# Patient Record
Sex: Female | Born: 1990 | Race: Black or African American | Hispanic: No | Marital: Single | Smoking: Heavy tobacco smoker
Health system: Southern US, Community
[De-identification: ages and names within clinical notes are randomized; demographics above are authoritative.]

## PROBLEM LIST (undated history)

## (undated) ENCOUNTER — Inpatient Hospital Stay (HOSPITAL_COMMUNITY): Payer: Self-pay

## (undated) DIAGNOSIS — Z789 Other specified health status: Secondary | ICD-10-CM

## (undated) HISTORY — PX: DILATION AND CURETTAGE OF UTERUS: SHX78

---
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
## 2012-11-03 ENCOUNTER — Other Ambulatory Visit: Payer: Self-pay | Admitting: Nurse Practitioner

## 2012-11-03 ENCOUNTER — Other Ambulatory Visit (HOSPITAL_COMMUNITY)
Admission: RE | Admit: 2012-11-03 | Discharge: 2012-11-03 | Disposition: A | Discharge status: Home / Self Care | Payer: Medicaid Other | Source: Ambulatory Visit | Attending: Obstetrics and Gynecology | Admitting: Obstetrics and Gynecology

## 2012-11-03 DIAGNOSIS — Z01419 Encounter for gynecological examination (general) (routine) without abnormal findings: Secondary | ICD-10-CM | POA: Insufficient documentation

## 2013-05-11 ENCOUNTER — Emergency Department (HOSPITAL_COMMUNITY)
Admission: EM | Admit: 2013-05-11 | Discharge: 2013-05-12 | Disposition: A | Discharge status: Home / Self Care | Payer: No Typology Code available for payment source | Attending: Emergency Medicine | Admitting: Emergency Medicine

## 2013-05-11 DIAGNOSIS — S6990XA Unspecified injury of unspecified wrist, hand and finger(s), initial encounter: Secondary | ICD-10-CM | POA: Insufficient documentation

## 2013-05-11 DIAGNOSIS — S4980XA Other specified injuries of shoulder and upper arm, unspecified arm, initial encounter: Secondary | ICD-10-CM | POA: Insufficient documentation

## 2013-05-11 DIAGNOSIS — Y9389 Activity, other specified: Secondary | ICD-10-CM | POA: Insufficient documentation

## 2013-05-11 DIAGNOSIS — IMO0002 Reserved for concepts with insufficient information to code with codable children: Secondary | ICD-10-CM | POA: Insufficient documentation

## 2013-05-11 DIAGNOSIS — S59909A Unspecified injury of unspecified elbow, initial encounter: Secondary | ICD-10-CM | POA: Insufficient documentation

## 2013-05-11 DIAGNOSIS — S0993XA Unspecified injury of face, initial encounter: Secondary | ICD-10-CM | POA: Insufficient documentation

## 2013-05-11 DIAGNOSIS — Y92410 Unspecified street and highway as the place of occurrence of the external cause: Secondary | ICD-10-CM | POA: Insufficient documentation

## 2013-05-11 DIAGNOSIS — S46909A Unspecified injury of unspecified muscle, fascia and tendon at shoulder and upper arm level, unspecified arm, initial encounter: Secondary | ICD-10-CM | POA: Insufficient documentation

## 2013-05-11 MED ORDER — IBUPROFEN 200 MG PO TABS
600.0000 mg | ORAL_TABLET | Freq: Once | ORAL | Status: AC
  Administered 2013-05-11: 600 mg via ORAL
  Filled 2013-05-11: qty 3

## 2013-05-11 MED ORDER — HYDROCODONE-ACETAMINOPHEN 5-325 MG PO TABS
1.0000 | ORAL_TABLET | Freq: Once | ORAL | Status: AC
  Administered 2013-05-11: 1 via ORAL
  Filled 2013-05-11: qty 1
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
## 2013-05-12 ENCOUNTER — Emergency Department (HOSPITAL_COMMUNITY): Payer: No Typology Code available for payment source

## 2013-05-12 MED ORDER — IBUPROFEN 800 MG PO TABS: 800.0000 mg | ORAL_TABLET | Freq: Three times a day (TID) | ORAL | Status: DC

## 2013-05-12 MED ORDER — CYCLOBENZAPRINE HCL 10 MG PO TABS: 10.0000 mg | ORAL_TABLET | Freq: Three times a day (TID) | ORAL | Status: DC | PRN

## 2013-05-12 MED ORDER — HYDROCODONE-ACETAMINOPHEN 5-325 MG PO TABS: 1.0000 | ORAL_TABLET | Freq: Four times a day (QID) | ORAL | Status: DC | PRN

## 2013-05-12 NOTE — ED Provider Notes (Signed)
Medical screening examination/treatment/procedure(s) were performed by non-physician practitioner and as supervising physician I was immediately available for consultation/collaboration.   Junius Argyle, MD 05/12/13 [phone]

## 2013-09-06 NOTE — L&D Delivery Note (Signed)
Delivery Note At 7:00 PM a viable female was delivered via Vaginal, Spontaneous Delivery (Presentation: occiput anterior ;  ).  APGAR:8 , 9; weight 4 lb 5.1 oz (1960 g).   Placenta status: Intact, Spontaneous.  Cord: 3 vessels with the following complications: None.  Cord pH: pending   Anesthesia: Epidural  Episiotomy: None Lacerations: 1st degree left periurethral  Suture Repair: 3.0 vicryl Est. Blood Loss (mL): 250  Mom to postpartum.  Baby to NICU.  Shawntell Dixson J. 02/23/2014, 7:20 PM

## 2013-09-13 LAB — OB RESULTS CONSOLE ABO/RH: RH TYPE: POSITIVE

## 2013-09-13 LAB — OB RESULTS CONSOLE RPR: RPR: NONREACTIVE

## 2013-09-13 LAB — OB RESULTS CONSOLE HIV ANTIBODY (ROUTINE TESTING): HIV: NONREACTIVE

## 2013-09-13 LAB — OB RESULTS CONSOLE RUBELLA ANTIBODY, IGM: RUBELLA: IMMUNE

## 2013-09-13 LAB — OB RESULTS CONSOLE HEPATITIS B SURFACE ANTIGEN: Hepatitis B Surface Ag: NEGATIVE

## 2013-09-13 LAB — OB RESULTS CONSOLE ANTIBODY SCREEN: ANTIBODY SCREEN: NEGATIVE

## 2013-10-04 ENCOUNTER — Inpatient Hospital Stay (HOSPITAL_COMMUNITY)
Admission: AD | Admit: 2013-10-04 | Discharge: 2013-10-04 | Disposition: A | Discharge status: Home / Self Care | Payer: Medicaid Other | Source: Ambulatory Visit | Attending: Obstetrics & Gynecology | Admitting: Obstetrics & Gynecology

## 2013-10-04 ENCOUNTER — Encounter (HOSPITAL_COMMUNITY): Payer: Self-pay

## 2013-10-04 ENCOUNTER — Inpatient Hospital Stay (HOSPITAL_COMMUNITY): Payer: Self-pay

## 2013-10-04 DIAGNOSIS — O468X2 Other antepartum hemorrhage, second trimester: Secondary | ICD-10-CM

## 2013-10-04 DIAGNOSIS — O418X20 Other specified disorders of amniotic fluid and membranes, second trimester, not applicable or unspecified: Secondary | ICD-10-CM

## 2013-10-04 DIAGNOSIS — R109 Unspecified abdominal pain: Secondary | ICD-10-CM | POA: Insufficient documentation

## 2013-10-04 DIAGNOSIS — O4590 Premature separation of placenta, unspecified, unspecified trimester: Secondary | ICD-10-CM

## 2013-10-04 DIAGNOSIS — O209 Hemorrhage in early pregnancy, unspecified: Secondary | ICD-10-CM | POA: Insufficient documentation

## 2013-10-04 HISTORY — DX: Other specified health status: Z78.9

## 2013-10-04 LAB — CBC
HCT: 36.7 % (ref 36.0–46.0)
HEMOGLOBIN: 12.9 g/dL (ref 12.0–15.0)
MCH: 29.3 pg (ref 26.0–34.0)
MCHC: 35.1 g/dL (ref 30.0–36.0)
MCV: 83.4 fL (ref 78.0–100.0)
PLATELETS: 219 10*3/uL (ref 150–400)
RBC: 4.4 MIL/uL (ref 3.87–5.11)
RDW: 13.4 % (ref 11.5–15.5)
WBC: 6.2 10*3/uL (ref 4.0–10.5)
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
## 2013-10-04 NOTE — MAU Provider Note (Signed)
History     CSN: [id number]  Arrival date and time: 10/04/13 [id number]   First Provider Initiated Contact with Patient 10/04/13 0400      Chief Complaint  Patient presents with  . Vaginal Bleeding   HPI [name] is a 23 y.o. [id number] at 4919w5d who presents to MAU today with new onset bright red bleeding since 0330 today. She also had right sided suprapubic pain at that time rated at 8/10 at the worst. She rates her pain now at 3/10. She denies bleeding earlier in the pregnancy aside from occasional spotting. She states that she woke up and the blood had soaked her clothes. She hasn't noted significant bleeding since then. She endorses nausea and occasional scant discharge. She denies vomiting, diarrhea, constipation, weakness, dizziness or fatigue today. She has a history of PTD at 26 weeks with previous pregnancy.   OB History   Grav Para Term Preterm Abortions TAB SAB Ect Mult Living   3 1 0 1 1 1 0 0 0 1       Past Medical History  Diagnosis Date  . No pertinent past medical history   . Medical history non-contributory     Past Surgical History  Procedure Laterality Date  . No past surgeries      History reviewed. No pertinent family history.  History  Substance Use Topics  . Smoking status: Never Smoker   . Smokeless tobacco: Never Used  . Alcohol Use: No    Allergies: No Known Allergies  Prescriptions prior to admission  Medication Sig Dispense Refill  . cyclobenzaprine (FLEXERIL) 10 MG tablet Take 1 tablet (10 mg total) by mouth 3 (three) times daily as needed for muscle spasms.  15 tablet  0  . HYDROcodone-acetaminophen (NORCO) 5-325 MG per tablet Take 1 tablet by mouth every 6 (six) hours as needed for pain.  20 tablet  0  . ibuprofen (ADVIL,MOTRIN) 800 MG tablet Take 1 tablet (800 mg total) by mouth 3 (three) times daily.  21 tablet  0    Review of Systems  Constitutional: Negative for fever and malaise/fatigue.  Gastrointestinal: Positive for nausea and  abdominal pain. Negative for vomiting, diarrhea and constipation.  Genitourinary: Negative for dysuria, urgency and frequency.       + vaginal bleeding, discharge  Neurological: Negative for dizziness and weakness.   Physical Exam   Blood pressure 119/65, pulse 85, temperature 98.1 F (36.7 C), temperature source Oral, resp. rate 20, height 5\' 3"  (1.6 m), weight 145 lb (65.772 kg), last menstrual period 05/10/2013.  Physical Exam  Constitutional: She is oriented to person, place, and time. She appears well-developed and well-nourished. No distress.  HENT:  Head: Normocephalic and atraumatic.  Cardiovascular: Normal rate, regular rhythm and normal heart sounds.   Respiratory: Effort normal and breath sounds normal. No respiratory distress.  GI: Soft. Bowel sounds are normal. She exhibits no distension and no mass. Tenderness: mild tenderness to palpation of the suprapubic region at midline. There is no rebound and no guarding.  Genitourinary: Uterus is enlarged (appropriate for GA) and tender (mild). Cervix exhibits no motion tenderness, no discharge and no friability. There is bleeding (small amount of blood in the vagina) around the vagina. No vaginal discharge found.  Neurological: She is alert and oriented to person, place, and time.  Skin: Skin is warm and dry. No erythema.  Psychiatric: She has a normal mood and affect.   Results for orders placed during the hospital encounter of 10/04/13 (from the  past 24 hour(s))  CBC     Status: None   Collection Time    10/04/13  5:05 AM      Result Value Range   WBC 6.2  4.0 - 10.5 K/uL   RBC 4.40  3.87 - 5.11 MIL/uL   Hemoglobin 12.9  12.0 - 15.0 g/dL   HCT 16.1  09.6 - 04.5 %   MCV 83.4  78.0 - 100.0 fL   MCH 29.3  26.0 - 34.0 pg   MCHC 35.1  30.0 - 36.0 g/dL   RDW 40.9  81.1 - 91.4 %   Platelets 219  150 - 400 K/uL   [place] - small subchorionic hemorrhage FHR - 153 bpm MAU Course  Procedures None  MDM FHR - 155 bpm with  doppler Discussed with Dr. Charlotta Newton. Order CBC and [place] today Discussed [place] and lab results with Dr. Charlotta Newton. Ok for discharge with precautions and pelvic rest Assessment and Plan  A: Subchorionic hemorrhage  P: Discharge home Pelvic rest advised Bleeding precautions discussed Patient advised to follow-up in the office in 1 week Patient may return to MAU as needed or if her condition were to change or worsen  Freddi Starr, PA-C  10/04/2013, 6:03 AM

## 2013-10-04 NOTE — Discharge Instructions (Signed)
Pelvic Rest °Pelvic rest is sometimes recommended for women when:  °· The placenta is partially or completely covering the opening of the cervix (placenta previa). °· There is bleeding between the uterine wall and the amniotic sac in the first trimester (subchorionic hemorrhage). °· The cervix begins to open without labor starting (incompetent cervix, cervical insufficiency). °· The labor is too early (preterm labor). °HOME CARE INSTRUCTIONS °· Do not have sexual intercourse, stimulation, or an orgasm. °· Do not use tampons, douche, or put anything in the vagina. °· Do not lift anything over 10 pounds (4.5 kg). °· Avoid strenuous activity or straining your pelvic muscles. °SEEK MEDICAL CARE IF:  °· You have any vaginal bleeding during pregnancy. Treat this as a potential emergency. °· You have cramping pain felt low in the stomach (stronger than menstrual cramps). °· You notice vaginal discharge (watery, mucus, or bloody). °· You have a low, dull backache. °· There are regular contractions or uterine tightening. °SEEK IMMEDIATE MEDICAL CARE IF: °You have vaginal bleeding and have placenta previa.  °Document Released: 12/18/2010 Document Revised: 11/15/2011 Document Reviewed: 12/18/2010 °ExitCare® Patient Information ©2014 [employer]. ° °Vaginal Bleeding During Pregnancy, Second Trimester °A small amount of bleeding (spotting) from the vagina is relatively common in pregnancy. It usually stops on its own. Various things can cause bleeding or spotting in pregnancy. Some bleeding may be related to the pregnancy, and some may not. Sometimes the bleeding is normal and is not a problem. However, bleeding can also be a sign of something serious. Be sure to tell your health care provider about any vaginal bleeding right away. °Some possible causes of vaginal bleeding during the second trimester include: °· Infection, inflammation, or growths on the cervix.   °· The placenta may be partially or completely covering the  opening of the cervix inside the uterus (placenta previa). °· The placenta may have separated from the uterus (abruption of the placenta).   °· You may be having early (preterm) labor.   °· The cervix may not be strong enough to keep a baby inside the uterus (cervical insufficiency).   °· Tiny cysts may have developed in the uterus instead of pregnancy tissue (molar pregnancy).  °HOME CARE INSTRUCTIONS  °Watch your condition for any changes. The following actions may help to lessen any discomfort you are feeling: °· Follow your health care provider's instructions for limiting your activity. If your health care provider orders bed rest, you may need to stay in bed and only get up to use the bathroom. However, your health care provider may allow you to continue light activity. °· If needed, make plans for someone to help with your regular activities and responsibilities while you are on bed rest. °· Keep track of the number of pads you use each day, how often you change pads, and how soaked (saturated) they are. Write this down. °· Do not use tampons. Do not douche. °· Do not have sexual intercourse or orgasms until approved by your health care provider. °· If you pass any tissue from your vagina, save the tissue so you can show it to your health care provider. °· Only take over-the-counter or prescription medicines as directed by your health care provider. °· Do not take aspirin because it can make you bleed. °· Do not exercise or perform any strenuous activities or heavy lifting without your health care provider's permission. °· Keep all follow-up appointments as directed by your health care provider. °SEEK MEDICAL CARE IF: °· You have any vaginal bleeding during any part   of your pregnancy. °· You have cramps or labor pains. °SEEK IMMEDIATE MEDICAL CARE IF:  °· You have severe cramps in your back or belly (abdomen). °· You have contractions. °· You have a fever, not controlled by medicine. °· You have chills. °· You  pass large clots or tissue from your vagina. °· Your bleeding increases. °· You feel lightheaded or weak, or you have fainting episodes. °· You are leaking fluid or have a gush of fluid from your vagina. °MAKE SURE YOU: °· Understand these instructions. °· Will watch your condition. °· Will get help right away if you are not doing well or get worse. °Document Released: 06/02/2005 Document Revised: 06/13/2013 Document Reviewed: 04/30/2013 °ExitCare® Patient Information ©2014 [employer]. ° °

## 2014-02-23 ENCOUNTER — Inpatient Hospital Stay (HOSPITAL_COMMUNITY)
Admission: AD | Admit: 2014-02-23 | Discharge: 2014-02-25 | DRG: 775 | Disposition: A | Discharge status: Home / Self Care | Payer: Medicaid Other | Source: Ambulatory Visit | Attending: Obstetrics and Gynecology | Admitting: Obstetrics and Gynecology

## 2014-02-23 ENCOUNTER — Encounter (HOSPITAL_COMMUNITY): Payer: Medicaid Other | Admitting: Anesthesiology

## 2014-02-23 ENCOUNTER — Inpatient Hospital Stay (HOSPITAL_COMMUNITY): Payer: Medicaid Other | Admitting: Anesthesiology

## 2014-02-23 ENCOUNTER — Encounter (HOSPITAL_COMMUNITY): Payer: Self-pay | Admitting: *Deleted

## 2014-02-23 DIAGNOSIS — O0930 Supervision of pregnancy with insufficient antenatal care, unspecified trimester: Secondary | ICD-10-CM

## 2014-02-23 DIAGNOSIS — Z22330 Carrier of Group B streptococcus: Secondary | ICD-10-CM | POA: Diagnosis not present

## 2014-02-23 DIAGNOSIS — O99892 Other specified diseases and conditions complicating childbirth: Secondary | ICD-10-CM | POA: Diagnosis present

## 2014-02-23 DIAGNOSIS — O4700 False labor before 37 completed weeks of gestation, unspecified trimester: Secondary | ICD-10-CM | POA: Diagnosis present

## 2014-02-23 DIAGNOSIS — O09219 Supervision of pregnancy with history of pre-term labor, unspecified trimester: Secondary | ICD-10-CM | POA: Diagnosis present

## 2014-02-23 DIAGNOSIS — IMO0001 Reserved for inherently not codable concepts without codable children: Secondary | ICD-10-CM

## 2014-02-23 DIAGNOSIS — O9989 Other specified diseases and conditions complicating pregnancy, childbirth and the puerperium: Secondary | ICD-10-CM

## 2014-02-23 LAB — URINALYSIS, ROUTINE W REFLEX MICROSCOPIC
Bilirubin Urine: NEGATIVE
GLUCOSE, UA: NEGATIVE mg/dL
Hgb urine dipstick: NEGATIVE
Ketones, ur: NEGATIVE mg/dL
LEUKOCYTES UA: NEGATIVE
NITRITE: NEGATIVE
PH: 7 (ref 5.0–8.0)
Protein, ur: NEGATIVE mg/dL
SPECIFIC GRAVITY, URINE: 1.015 (ref 1.005–1.030)
Urobilinogen, UA: 0.2 mg/dL (ref 0.0–1.0)

## 2014-02-23 LAB — CBC
HCT: 35.9 % — ABNORMAL LOW (ref 36.0–46.0)
HEMOGLOBIN: 12.4 g/dL (ref 12.0–15.0)
MCH: 30.4 pg (ref 26.0–34.0)
MCHC: 34.5 g/dL (ref 30.0–36.0)
MCV: 88 fL (ref 78.0–100.0)
PLATELETS: 194 10*3/uL (ref 150–400)
RBC: 4.08 MIL/uL (ref 3.87–5.11)
RDW: 13.8 % (ref 11.5–15.5)
WBC: 12 10*3/uL — AB (ref 4.0–10.5)

## 2014-02-23 LAB — RAPID URINE DRUG SCREEN, HOSP PERFORMED
Amphetamines: NOT DETECTED
BARBITURATES: NOT DETECTED
BENZODIAZEPINES: NOT DETECTED
Cocaine: NOT DETECTED
Opiates: NOT DETECTED
TETRAHYDROCANNABINOL: NOT DETECTED

## 2014-02-23 LAB — OB RESULTS CONSOLE GBS: GBS: POSITIVE

## 2014-02-23 MED ORDER — LORATADINE 10 MG PO TABS
10.0000 mg | ORAL_TABLET | Freq: Every day | ORAL | Status: DC
Start: 2014-02-23 — End: 2014-02-25
  Administered 2014-02-23 – 2014-02-25 (×3): 10 mg via ORAL
  Filled 2014-02-23 (×3): qty 1

## 2014-02-23 MED ORDER — LACTATED RINGERS IV SOLN: INTRAVENOUS | Status: DC

## 2014-02-23 MED ORDER — METHYLERGONOVINE MALEATE 0.2 MG/ML IJ SOLN: 0.2000 mg | INTRAMUSCULAR | Status: DC | PRN

## 2014-02-23 MED ORDER — OXYTOCIN 40 UNITS IN LACTATED RINGERS INFUSION - SIMPLE MED
62.5000 mL/h | INTRAVENOUS | Status: DC
  Administered 2014-02-23: 62.5 mL/h via INTRAVENOUS
  Filled 2014-02-23: qty 1000

## 2014-02-23 MED ORDER — OXYCODONE-ACETAMINOPHEN 5-325 MG PO TABS: 1.0000 | ORAL_TABLET | ORAL | Status: DC | PRN

## 2014-02-23 MED ORDER — TETANUS-DIPHTH-ACELL PERTUSSIS 5-2.5-18.5 LF-MCG/0.5 IM SUSP
0.5000 mL | Freq: Once | INTRAMUSCULAR | Status: AC
  Administered 2014-02-25: 0.5 mL via INTRAMUSCULAR
  Filled 2014-02-23: qty 0.5

## 2014-02-23 MED ORDER — EPHEDRINE 5 MG/ML INJ
10.0000 mg | INTRAVENOUS | Status: DC | PRN
Start: 2014-02-23 — End: 2014-02-23
  Filled 2014-02-23: qty 2

## 2014-02-23 MED ORDER — LACTATED RINGERS IV SOLN: 500.0000 mL | Freq: Once | INTRAVENOUS | Status: DC

## 2014-02-23 MED ORDER — ZOLPIDEM TARTRATE 5 MG PO TABS: 5.0000 mg | ORAL_TABLET | Freq: Every evening | ORAL | Status: DC | PRN

## 2014-02-23 MED ORDER — EPHEDRINE 5 MG/ML INJ
10.0000 mg | INTRAVENOUS | Status: DC | PRN
  Filled 2014-02-23: qty 2

## 2014-02-23 MED ORDER — CITRIC ACID-SODIUM CITRATE 334-500 MG/5ML PO SOLN: 30.0000 mL | ORAL | Status: DC | PRN

## 2014-02-23 MED ORDER — IBUPROFEN 600 MG PO TABS
600.0000 mg | ORAL_TABLET | Freq: Four times a day (QID) | ORAL | Status: DC
  Administered 2014-02-23 – 2014-02-25 (×5): 600 mg via ORAL
  Filled 2014-02-23 (×5): qty 1

## 2014-02-23 MED ORDER — PHENYLEPHRINE 40 MCG/ML (10ML) SYRINGE FOR IV PUSH (FOR BLOOD PRESSURE SUPPORT)
PREFILLED_SYRINGE | INTRAVENOUS | Status: AC
  Filled 2014-02-23: qty 10

## 2014-02-23 MED ORDER — DIPHENHYDRAMINE HCL 25 MG PO CAPS
25.0000 mg | ORAL_CAPSULE | Freq: Four times a day (QID) | ORAL | Status: DC | PRN
Start: 2014-02-23 — End: 2014-02-25

## 2014-02-23 MED ORDER — OXYTOCIN BOLUS FROM INFUSION: 500.0000 mL | INTRAVENOUS | Status: DC

## 2014-02-23 MED ORDER — PHENYLEPHRINE 40 MCG/ML (10ML) SYRINGE FOR IV PUSH (FOR BLOOD PRESSURE SUPPORT)
80.0000 ug | PREFILLED_SYRINGE | INTRAVENOUS | Status: DC | PRN
  Filled 2014-02-23: qty 2

## 2014-02-23 MED ORDER — SENNOSIDES-DOCUSATE SODIUM 8.6-50 MG PO TABS
2.0000 | ORAL_TABLET | ORAL | Status: DC
  Administered 2014-02-25: 2 via ORAL
  Filled 2014-02-23 (×2): qty 2

## 2014-02-23 MED ORDER — METHYLERGONOVINE MALEATE 0.2 MG PO TABS: 0.2000 mg | ORAL_TABLET | ORAL | Status: DC | PRN

## 2014-02-23 MED ORDER — LIDOCAINE HCL (PF) 1 % IJ SOLN
30.0000 mL | INTRAMUSCULAR | Status: DC | PRN
  Filled 2014-02-23: qty 30

## 2014-02-23 MED ORDER — ONDANSETRON HCL 4 MG/2ML IJ SOLN: 4.0000 mg | INTRAMUSCULAR | Status: DC | PRN

## 2014-02-23 MED ORDER — FERROUS SULFATE 325 (65 FE) MG PO TABS
325.0000 mg | ORAL_TABLET | Freq: Two times a day (BID) | ORAL | Status: DC
  Administered 2014-02-24 – 2014-02-25 (×2): 325 mg via ORAL
  Filled 2014-02-23 (×2): qty 1

## 2014-02-23 MED ORDER — ACETAMINOPHEN 325 MG PO TABS: 650.0000 mg | ORAL_TABLET | ORAL | Status: DC | PRN

## 2014-02-23 MED ORDER — WITCH HAZEL-GLYCERIN EX PADS: 1.0000 "application " | MEDICATED_PAD | CUTANEOUS | Status: DC | PRN

## 2014-02-23 MED ORDER — PHENYLEPHRINE 40 MCG/ML (10ML) SYRINGE FOR IV PUSH (FOR BLOOD PRESSURE SUPPORT)
80.0000 ug | PREFILLED_SYRINGE | INTRAVENOUS | Status: DC | PRN
Start: 2014-02-23 — End: 2014-02-23
  Filled 2014-02-23: qty 2

## 2014-02-23 MED ORDER — IBUPROFEN 600 MG PO TABS: 600.0000 mg | ORAL_TABLET | Freq: Four times a day (QID) | ORAL | Status: DC | PRN

## 2014-02-23 MED ORDER — PRENATAL MULTIVITAMIN CH
1.0000 | ORAL_TABLET | Freq: Every day | ORAL | Status: DC
  Administered 2014-02-24: 1 via ORAL
  Filled 2014-02-23: qty 1

## 2014-02-23 MED ORDER — LANOLIN HYDROUS EX OINT: TOPICAL_OINTMENT | CUTANEOUS | Status: DC | PRN

## 2014-02-23 MED ORDER — OXYCODONE-ACETAMINOPHEN 5-325 MG PO TABS
1.0000 | ORAL_TABLET | ORAL | Status: DC | PRN
  Administered 2014-02-23 (×2): 1 via ORAL
  Administered 2014-02-24: 2 via ORAL
  Administered 2014-02-24: 1 via ORAL
  Filled 2014-02-23: qty 2
  Filled 2014-02-23 (×3): qty 1

## 2014-02-23 MED ORDER — EPHEDRINE 5 MG/ML INJ
INTRAVENOUS | Status: AC
  Filled 2014-02-23: qty 4

## 2014-02-23 MED ORDER — FENTANYL 2.5 MCG/ML BUPIVACAINE 1/10 % EPIDURAL INFUSION (WH - ANES)
INTRAMUSCULAR | Status: AC
  Filled 2014-02-23: qty 125

## 2014-02-23 MED ORDER — ONDANSETRON HCL 4 MG/2ML IJ SOLN: 4.0000 mg | Freq: Four times a day (QID) | INTRAMUSCULAR | Status: DC | PRN

## 2014-02-23 MED ORDER — DIPHENHYDRAMINE HCL 50 MG/ML IJ SOLN: 12.5000 mg | INTRAMUSCULAR | Status: DC | PRN

## 2014-02-23 MED ORDER — LACTATED RINGERS IV SOLN: 500.0000 mL | INTRAVENOUS | Status: DC | PRN

## 2014-02-23 MED ORDER — FENTANYL 2.5 MCG/ML BUPIVACAINE 1/10 % EPIDURAL INFUSION (WH - ANES)
14.0000 mL/h | INTRAMUSCULAR | Status: DC | PRN
  Administered 2014-02-23: 14 mL/h via EPIDURAL

## 2014-02-23 MED ORDER — DIBUCAINE 1 % RE OINT: 1.0000 "application " | TOPICAL_OINTMENT | RECTAL | Status: DC | PRN

## 2014-02-23 MED ORDER — SIMETHICONE 80 MG PO CHEW: 80.0000 mg | CHEWABLE_TABLET | ORAL | Status: DC | PRN

## 2014-02-23 MED ORDER — SODIUM CHLORIDE 0.9 % IV SOLN
2.0000 g | Freq: Once | INTRAVENOUS | Status: AC
  Administered 2014-02-23: 2 g via INTRAVENOUS
  Filled 2014-02-23: qty 2000

## 2014-02-23 MED ORDER — FENTANYL 2.5 MCG/ML BUPIVACAINE 1/10 % EPIDURAL INFUSION (WH - ANES): 14.0000 mL/h | INTRAMUSCULAR | Status: DC | PRN

## 2014-02-23 MED ORDER — LIDOCAINE HCL (PF) 1 % IJ SOLN
INTRAMUSCULAR | Status: DC | PRN
  Administered 2014-02-23 (×2): 5 mL

## 2014-02-23 MED ORDER — BENZOCAINE-MENTHOL 20-0.5 % EX AERO
1.0000 "application " | INHALATION_SPRAY | CUTANEOUS | Status: DC | PRN
  Administered 2014-02-23: 1 via TOPICAL
  Filled 2014-02-23: qty 56

## 2014-02-23 MED ORDER — ONDANSETRON HCL 4 MG PO TABS
4.0000 mg | ORAL_TABLET | ORAL | Status: DC | PRN
  Administered 2014-02-24: 4 mg via ORAL
  Filled 2014-02-23: qty 1

## 2014-02-23 NOTE — Consult Note (Signed)
Neonatology Note:  Attendance at Delivery:  I was asked by Dr. Cole to attend this NSVD at 34 0/7 weeks following a precipitous preterm labor. The mother is a G3P1A1 B pos, GBS positive with limited PNC who arrived to MAU 8 cm dilated today. AROM 1 hour prior to delivery, fluid clear. Mother received 1 dose of Ampicillin 2 hours before delivery. Infant good HR and tone, but somewhat irregular respirations. Needed only minimal bulb suctioning, and we gave stimulation. We placed a pulse oximeter, which showed normal O2 saturations in room air. Ap 8/9. Lungs clear to ausc in DR. Held briefly by his mother, then transported to the NICU for further care. His father was in attendance.  Sherrey North C. Domenico Achord, MD  

## 2014-02-23 NOTE — Anesthesia Procedure Notes (Signed)
Epidural Patient location during procedure: OB Start time: 02/23/2014 5:33 PM  Staffing Anesthesiologist: Brayton CavesJACKSON, Maryjo Ragon Performed by: anesthesiologist   Preanesthetic Checklist Completed: patient identified, site marked, surgical consent, pre-op evaluation, timeout performed, IV checked, risks and benefits discussed and monitors and equipment checked  Epidural Patient position: sitting Prep: site prepped and draped and DuraPrep Patient monitoring: continuous pulse ox and blood pressure Approach: midline Location: L3-L4 Injection technique: LOR air  Needle:  Needle type: Tuohy  Needle gauge: 17 G Needle length: 9 cm and 9 Needle insertion depth: 5 cm cm Catheter type: closed end flexible Catheter size: 19 Gauge Catheter at skin depth: 10 cm Test dose: negative  Assessment Events: blood not aspirated, injection not painful, no injection resistance, negative IV test and no paresthesia  Additional Notes Patient identified.  Risk benefits discussed including failed block, incomplete pain control, headache, nerve damage, paralysis, blood pressure changes, nausea, vomiting, reactions to medication both toxic or allergic, and postpartum back pain.  Patient expressed understanding and wished to proceed.  All questions were answered.  Sterile technique used throughout procedure and epidural site dressed with sterile barrier dressing. No paresthesia or other complications noted.The patient did not experience any signs of intravascular injection such as tinnitus or metallic taste in mouth nor signs of intrathecal spread such as rapid motor block. Please see nursing notes for vital signs.

## 2014-02-23 NOTE — MAU Note (Signed)
Patient states she is having contractions every 2 minutes. Denies bleeding or leaking. Reports good fetal movement. States she was going to [facility] but has not been in about one month. Had a preterm delivery with her last child.

## 2014-02-23 NOTE — MAU Note (Signed)
Assumed care of patient.

## 2014-02-23 NOTE — Anesthesia Preprocedure Evaluation (Signed)

## 2014-02-23 NOTE — H&P (Signed)
Kaylee Kim is a 23 y.o. female [id number] at 34 wks and 0 day EGA based on 11 wk u/s with EDD 04/06/2014. Pregnancy complicated by h/o PTL at 26 wks and lack of prenatal care for the last 3 months. She was receiving care with Dr. Richardson Doppole at [facility] . She was receiving Makena injections weekly.  Her last visit was at 21 wks and 4 days. She was dismissed on 01/25/2014 due to noncompliance. [facility] complaining of contractions every 2 minutes. No lof +FM  No vaginal bleeding.     History OB History   Grav Para Term Preterm Abortions TAB SAB Ect Mult Living   3 1 0 1 1 1 0 0 0 1      Past Medical History  Diagnosis Date  . No pertinent past medical history   . Medical history non-contributory    Past Surgical History  Procedure Laterality Date  . Dilation and curettage of uterus     Family History: family history is not on file. Social History:  reports that she has never smoked. She has never used smokeless tobacco. She reports that she does not drink alcohol or use illicit drugs.   Prenatal Transfer Tool  Maternal Diabetes:  Pt did not have testing as she had stopped prenatal care  Genetic Screening:  Maternal Ultrasounds/Referrals: Normal Fetal Ultrasounds or other Referrals:  None Maternal Substance Abuse:  No Significant Maternal Medications:  None Significant Maternal Lab Results:  Lab values include: Group B Strep positive Other Comments:   no pretnatal care after 21 wks.   Review of Systems  Constitutional: Negative.   HENT: Negative.   Respiratory: Negative.   Cardiovascular: Negative.   Gastrointestinal: Negative.   Genitourinary: Negative.   Musculoskeletal: Negative.   Neurological: Negative.   Endo/Heme/Allergies: Negative.   Psychiatric/Behavioral: Negative.     Dilation: 8 Effacement (%): 100 Station: 0 Exam by:: Dellie BurnsScarlett Murray, RN BSN Blood pressure 115/60, pulse 87, temperature 98.2 F (36.8 C), temperature source Oral, resp.  rate 16, height 5\' 4"  (1.626 m), weight 67.314 kg (148 lb 6.4 oz), last menstrual period 05/10/2013. Maternal Exam:  Abdomen: Patient reports no abdominal tenderness. Fetal presentation: vertex  Introitus: Normal vulva. Normal vagina.    Fetal Exam Fetal Monitor Review: Baseline rate: 145.  Variability: moderate (6-25 bpm).   Pattern: variable decelerations.    Fetal State Assessment: Category II - tracings are indeterminate.     Physical Exam  Vitals reviewed. Constitutional: She is oriented to person, place, and time. She appears well-developed and well-nourished.  HENT:  Head: Normocephalic and atraumatic.  Neck: Normal range of motion.  Cardiovascular: Normal rate and regular rhythm.   Respiratory: Breath sounds normal. She is in respiratory distress.  Genitourinary: Vagina normal.  Musculoskeletal: Normal range of motion. She exhibits no edema.  Neurological: She is alert and oriented to person, place, and time.  Skin: Skin is warm and dry.  Psychiatric: She has a normal mood and affect.    Prenatal labs: ABO, Rh:   B positive    Antibody:  Negative  Rubella:  Immune  RPR:   Nonreactive  HBsAg:   Negative  HIV:   Nonereactive  GBS:   positive in urine culture  Assessment/Plan: 34 wks and 0 days with preterm labor  Poor prenatal care GBS positive Ampicillin dur to advanced labor  Pt desires an epidural.   check toxicology screen     COLE,TARA J. 02/23/2014, 5:25 PM

## 2014-02-24 LAB — HIV ANTIBODY (ROUTINE TESTING W REFLEX): HIV 1&2 Ab, 4th Generation: NONREACTIVE

## 2014-02-24 LAB — CBC
HCT: 34.5 % — ABNORMAL LOW (ref 36.0–46.0)
Hemoglobin: 12 g/dL (ref 12.0–15.0)
MCH: 30.3 pg (ref 26.0–34.0)
MCHC: 34.8 g/dL (ref 30.0–36.0)
MCV: 87.1 fL (ref 78.0–100.0)
PLATELETS: 190 10*3/uL (ref 150–400)
RBC: 3.96 MIL/uL (ref 3.87–5.11)
RDW: 13.8 % (ref 11.5–15.5)
WBC: 14.6 10*3/uL — ABNORMAL HIGH (ref 4.0–10.5)

## 2014-02-24 LAB — RPR

## 2014-02-24 NOTE — Progress Notes (Signed)
Clinical Social Work Department PSYCHOSOCIAL ASSESSMENT - MATERNAL/CHILD 02-21-2014  Patient:  Kaylee Kim  Account Number:  [id number]  Admit Date:  2014-01-13  Kaylee Kim Name:   Kaylee Kim    Clinical Social Worker:  Terri Piedra, LCSW   Date/Time:  04/05/14 10:00 AM  Date Referred:  2014-07-11   Referral source  Physician     Referred reason  Kaylee Kim   Other referral source:    I:  FAMILY / HOME ENVIRONMENT Child's legal guardian:  PARENT  Guardian - Name Guardian - Age Guardian - Address  Kaylee Kim [address] [place], [place] [address]  Kaylee Kim 23 Lives with a friend in [place]   Other household support members/support persons Name Relationship DOB  Kaylee Kim [facility] 9/12   Other support:   [name] states her mother is her main support person other than [name].    II  PSYCHOSOCIAL DATA Information Source:  Family Interview  Financial and [employer] Employment:   [name] recently lost her job at the [employer] in [employer] [place] and is looking for another job.  [name] works in Contractor at [employer].   Financial resources:   If Medicaid - County:    School / Grade:   Maternity Care Coordinator / Child Services Coordination / Early Interventions:   Kaylee Kim  Cultural issues impacting care:   None stated    III  STRENGTHS Strengths  Supportive family/friends  Understanding of illness  Other - See comment   Strength comment:  [name] states her son goes to [employer], but that she plans to take baby to [employer].  CSW asked if she has called them to see if they are accepting new Medicaid pts and she said she has not, but she will. Parents state the ability to get all necessary baby supplies.   IV  RISK FACTORS AND CURRENT PROBLEMS Current Problem:  None   Risk Factor & Current Problem Patient Issue Family Issue Risk Factor / Current Problem Comment         V  SOCIAL WORK ASSESSMENT  CSW met with parents in [name]'s  3rd floor room to introduce myself, complete assessment due to NICU admission and limited [facility] and offer support.  Parents were pleasant and welcoming, though quiet throughout assessment.  Parents were in [name]'s hospital bed together, which made it difficult for CSW to see or hear [name] very well.  [name] and CSW remember each other from [name]'s first child being born at 57 weeks in 05/2011.  [name] states he, Kaylee Kim, is doing very well at this time.  51 father is different from her first child.  He appears supportive and states this is his first baby.  Parents report the pregnancy was unplanned, but that they are both happy about baby Kaylee Kim.  CSW inquired about [name]'s lack of PNC after 21 weeks and she states she is on her father's insurance and that it did not cover much of her [facility] and therefore she stopped going to the doctor.  She states she applied for Medicaid 2 months ago in [facility] and found out that her application was "lost" when she called to check on the status.  She states she applied again 2 weeks ago in [place].  CSW explained that she may want to speak to a financial counselor in the hospital tomorrow to check on the current status and provided her with the phone number.  She agreed to call tomorrow.  CSW explained hospital  drug screen policy due to limited [facility] and [name] stated no concerns.  [name] states he is from [place] and says it is really just him and his 36 year old brother down here.  They used to live together, but his brother now lives with his girl friend and they have a baby on the way.  He states he lives with a friend.  Support for him seems very limited.  [name] states she has a supportive family.  She moved back in with her mother 1-2 weeks ago after losing her job and not being able to afford her apartment in [facility] any longer.  She states her mother is supportive of [name] living in the home with her two children.  [name]'s 65 year old brother also lives in the home.  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  She thinks it was due to his extreme prematurity and her young age.  [name] states her mother really helped her through that time and she does not think she will experience it again seeing that baby's NICU stay will be much different this time.  CSW discuss signs and symptoms to watch for, however, and asked [name] to contact CSW and or her doctor if she has concerns at any time.  She agreed.  Parents state they do not have baby supplies yet and planned to "get them next week."  CSW asked them if this is still the plan and they said yes.  CSW briefly discussed, in general terms, what to expect from an admission of a baby at 34 weeks and encouraged them not to have expectations regarding baby's discharge date.  CSW explained ongoing support services offered by NICU CSW and gave contact information.  Parents were appreciative.   VI SOCIAL WORK PLAN Social Work Secretary/administrator Education  Psychosocial Support/Ongoing Assessment of Needs   Type of pt/family education:   Hospital drug screen policy  PPD signs and symptoms  Ongoing support services offered by NICU CSW   If child protective services report - county:   If child protective services report - date:   Information/referral to community resources comment:   Retail banker   Other social work plan:   CSW will monitor drug screen results.

## 2014-02-24 NOTE — Progress Notes (Signed)
Post Partum Day 1SVD at 34 wks 0 days  Subjective: up ad lib, voiding and  pt with episode of vomiting after taking percocet on an empty stomach   Objective: Blood pressure 109/61, pulse 97, temperature 97.5 F (36.4 C), temperature source Oral, resp. rate 15, height 5\' 4"  (1.626 m), weight 67.132 kg (148 lb), last menstrual period 05/10/2013, SpO2 100.00%, unknown if currently breastfeeding.  Physical Exam:  General: alert and cooperative Lochia: appropriate Uterine Fundus: firm Incision: NA DVT Evaluation: No evidence of DVT seen on physical exam.   Recent Labs  02/23/14 1448 02/24/14 0520  HGB 12.4 12.0  HCT 35.9* 34.5*    Assessment/Plan: Plan for discharge tomorrow and Social Work consult Routine postpartum care Pt advised to try motrin for pain .Marland Kitchen. Avoid percocet unless her pain is uncontrolled with motrin    LOS: 1 day   COLE,TARA J. 02/24/2014, 11:13 AM

## 2014-02-24 NOTE — Plan of Care (Signed)
Problem: Phase II Progression Outcomes Goal: Pain controlled on oral analgesia Outcome: Completed/Met Date Met:  02/24/14 Good pain control on po Motrin and Percocet. Goal: Progress activity as tolerated unless otherwise ordered Outcome: Completed/Met Date Met:  02/24/14 Walks frequently to NICU and tolerates it very well. Goal: Tolerating diet Outcome: Completed/Met Date Met:  02/24/14 Tolerating a Regular diet well.Nauseated with vomiting early in day due to pain medication on a empty stomach.Advised to eat something before taking a narcotic pain medication. Goal: Other Phase II Outcomes/Goals Outcome: Completed/Met Date Met:  02/24/14 Understands the importance of pumping on a regular every three hour schedule to get her breast milk  In faster,and has pumped more frequently today.

## 2014-02-24 NOTE — Anesthesia Postprocedure Evaluation (Addendum)
  Anesthesia Post-op Note  Patient: Kaylee Kim  Procedure(s) Performed: * No procedures listed *  Patient Location: MotherBaby  Anesthesia Type:Epidural  Level of Consciousness: awake and alert   Airway and Oxygen Therapy: Patient Spontanous Breathing  Post-op Pain: mild  Post-op Assessment: Post-op Vital signs reviewed, No signs of Nausea or vomiting, Pain level controlled, No headache, No residual numbness and No residual motor weakness  Post-op Vital Signs: Reviewed and stable  Last Vitals:  Filed Vitals:   02/24/14 0527  BP: 109/61  Pulse: 97  Temp: 36.4 C  Resp: 15    Complications: No apparent anesthesia complications

## 2014-02-25 DIAGNOSIS — O09219 Supervision of pregnancy with history of pre-term labor, unspecified trimester: Secondary | ICD-10-CM | POA: Diagnosis present

## 2014-02-25 MED ORDER — PRENATAL MULTIVITAMIN CH: 1.0000 | ORAL_TABLET | Freq: Every day | ORAL | Status: DC

## 2014-02-25 MED ORDER — IBUPROFEN 600 MG PO TABS: 600.0000 mg | ORAL_TABLET | Freq: Four times a day (QID) | ORAL | Status: DC | PRN

## 2014-02-25 MED ORDER — MEDROXYPROGESTERONE ACETATE 150 MG/ML IM SUSP
150.0000 mg | Freq: Once | INTRAMUSCULAR | Status: AC
  Administered 2014-02-25: 150 mg via INTRAMUSCULAR
  Filled 2014-02-25: qty 1

## 2014-02-25 NOTE — Lactation Note (Signed)
This note was copied from the chart of [facility]. Lactation Consultation Note      Follow up consuslt with this mom of a NICU baby, now 36 2/7 weeks CGA, and 39 hours post partum. Mom has been pumping, and is getting drops of transitional milk. She spoke to [facility], and has an appointment for 2  Weeks from now. i am willing to loan her a DEP, but she and dad say they do not have the $30. I ecocuraged mom to ask her mom, who is coming to pick her up. In the meantime, I gave mom a hand pump, instructed her in it;s use, and encouraged her to bring her pumping kit with her to NICU each time she visit the baby. I will follow this family in the nICU Patient Name: Kaylee Kim DSKAJ'G Date: 02/25/2014 Reason for consult: Follow-up assessment;NICU baby;Late preterm infant   Maternal Data Formula Feeding for Exclusion: Yes (baby in NICU) Does the patient have breastfeeding experience prior to this delivery?: Yes  Feeding    LATCH Score/Interventions                      Lactation Tools Discussed/Used Pump Review: Setup, frequency, and cleaning;Milk Storage;Other (comment) (hand expression )   Consult Status Consult Status: PRN Follow-up type: In-patient ([place])    Tonna Corner 02/25/2014, 10:31 AM

## 2014-02-25 NOTE — Discharge Summary (Signed)
Obstetric Discharge Summary Reason for Admission: onset of labor and preterm labor at 34 wks with advanced cervical dilation on arrival  Prenatal Procedures: none Intrapartum Procedures: spontaneous vaginal delivery Postpartum Procedures: none Complications-Operative and Postpartum: periurethral  laceration Hemoglobin  Date Value Ref Range Status  02/24/2014 12.0  12.0 - 15.0 g/dL Final     HCT  Date Value Ref Range Status  02/24/2014 34.5* 36.0 - 46.0 % Final    Physical Exam:  General: alert and cooperative Lochia: appropriate Uterine Fundus: firm Incision: NA DVT Evaluation: No evidence of DVT seen on physical exam.  Discharge Diagnoses: preterm delivery  Discharge Information: Date: 02/25/2014 Activity: pelvic rest Diet: routine Medications: PNV, Ibuprofen and depoprovera given prior to discharge  Condition: stable Instructions: refer to practice specific booklet Discharge to: home Follow-up Information   Follow up with Jessee AversOLE,Aria Pickrell J., MD. Schedule an appointment as soon as possible for a visit in 2 weeks. ( postpartum visit )    Specialty:  Obstetrics and Gynecology   Contact information:   [address] [place] [address] [phone]       Newborn Data: Live born female  Birth Weight: 4 lb 5.1 oz (1960 g) APGAR: 8, 9 Newborn in NICU due to prematurity .  Jostin Rue J. 02/25/2014, 7:58 AM

## 2014-02-25 NOTE — Progress Notes (Signed)
D/c teaching complete  Pt to catch bus home

## 2014-02-26 LAB — GC/CHLAMYDIA PROBE AMP
CT PROBE, AMP APTIMA: NEGATIVE
GC Probe RNA: NEGATIVE

## 2014-03-04 ENCOUNTER — Ambulatory Visit: Payer: Self-pay

## 2014-03-04 ENCOUNTER — Ambulatory Visit (HOSPITAL_COMMUNITY): Payer: Medicaid Other

## 2014-03-04 NOTE — Lactation Note (Signed)
This note was copied from the chart of Kaylee Caroline Morearis Dieterich. Lactation Consultation Note      Follow up consult with this mom and baby, who is now 35 2/[redacted] weeks gestation , weighing  4lbs 2.8oz. Mom is till pumping and putting baby to breast a couple of times a day. I made an o/p appointment for mom for 1 week from today, 7/6 at 1 pm.  Patient Name: Kaylee Kim ZOXWR'UToday's Date: 03/04/2014     Maternal Data    Feeding    LATCH Score/Interventions                      Lactation Tools Discussed/Used     Consult Status      Alfred LevinsLee, Christine Anne 03/04/2014, 2:02 PM

## 2014-03-11 ENCOUNTER — Encounter (HOSPITAL_COMMUNITY): Payer: Self-pay

## 2014-06-14 ENCOUNTER — Encounter (HOSPITAL_COMMUNITY): Payer: Self-pay | Admitting: *Deleted

## 2014-06-14 ENCOUNTER — Inpatient Hospital Stay (HOSPITAL_COMMUNITY)
Admission: AD | Admit: 2014-06-14 | Discharge: 2014-06-14 | Disposition: A | Discharge status: Home / Self Care | Payer: Medicaid Other | Source: Ambulatory Visit | Attending: Family Medicine | Admitting: Family Medicine

## 2014-06-14 DIAGNOSIS — F17290 Nicotine dependence, other tobacco product, uncomplicated: Secondary | ICD-10-CM | POA: Diagnosis not present

## 2014-06-14 DIAGNOSIS — N921 Excessive and frequent menstruation with irregular cycle: Secondary | ICD-10-CM | POA: Diagnosis not present

## 2014-06-14 DIAGNOSIS — N938 Other specified abnormal uterine and vaginal bleeding: Secondary | ICD-10-CM | POA: Insufficient documentation

## 2014-06-14 DIAGNOSIS — Z3009 Encounter for other general counseling and advice on contraception: Secondary | ICD-10-CM

## 2014-06-14 DIAGNOSIS — N939 Abnormal uterine and vaginal bleeding, unspecified: Secondary | ICD-10-CM | POA: Diagnosis present

## 2014-06-14 LAB — URINALYSIS, ROUTINE W REFLEX MICROSCOPIC
Bilirubin Urine: NEGATIVE
Glucose, UA: NEGATIVE mg/dL
Ketones, ur: NEGATIVE mg/dL
Leukocytes, UA: NEGATIVE
Nitrite: NEGATIVE
Protein, ur: NEGATIVE mg/dL
Specific Gravity, Urine: 1.01 (ref 1.005–1.030)
Urobilinogen, UA: 0.2 mg/dL (ref 0.0–1.0)
pH: 6.5 (ref 5.0–8.0)

## 2014-06-14 LAB — URINE MICROSCOPIC-ADD ON

## 2014-06-14 LAB — POCT PREGNANCY, URINE: PREG TEST UR: NEGATIVE

## 2014-06-14 MED ORDER — NORGESTIMATE-ETH ESTRADIOL 0.25-35 MG-MCG PO TABS: 1.0000 | ORAL_TABLET | Freq: Every day | ORAL | Status: DC

## 2014-06-14 NOTE — MAU Note (Signed)
Patient states she had a vaginal delivery on 6-20 and had Depo on 6-22. States she had bleeding until the second week of August and has continued to have spotting almost every day. Did not get the Depo in September.

## 2014-06-14 NOTE — MAU Provider Note (Signed)
History     CSN: [id number]  Arrival date and time: 06/14/14 1316   None     Chief Complaint  Patient presents with  . Vaginal Bleeding   HPI  Kaylee Kim is a 23 y.o. [id number] non pregnant female who presents to the MAU complaining of bleeding and spotting x 3 months. She states that the bleeding started after she delivered her second baby on 6/20. She had a depo shot on 6/22 and she has been bleeding ever since. She states the bleeding has been decreasing and the color has changed to a darker red. She is still spotting today. She was schedule to have her 2nd shot of depo in September but did not go due to concern for bleeding. She denies dizziness, chest pain, SOB, and N/V. She endorses a 2 day headache earlier this week. She says it was debilitating and grades it at a 10/10.  She denies h/a today.  Pt declines pelvic exam today, denies risk for STDs.  OB History   Grav Para Term Preterm Abortions TAB SAB Ect Mult Living   3 2 0 2 1 1 0 0 0 2       Past Medical History  Diagnosis Date  . No pertinent past medical history   . Medical history non-contributory     Past Surgical History  Procedure Laterality Date  . Dilation and curettage of uterus      History reviewed. No pertinent family history.  History  Substance Use Topics  . Smoking status: Heavy Tobacco Smoker -- 3.00 packs/day    Types: Cigars  . Smokeless tobacco: Never Used  . Alcohol Use: No    Allergies: No Known Allergies  Prescriptions prior to admission  Medication Sig Dispense Refill  . ibuprofen (ADVIL,MOTRIN) 600 MG tablet Take 1 tablet (600 mg total) by mouth every 6 (six) hours as needed.  30 tablet  1  . Prenatal Vit-Fe Fumarate-FA (PRENATAL MULTIVITAMIN) TABS tablet Take 1 tablet by mouth daily at 12 noon.  30 tablet  12   Results for orders placed during the hospital encounter of 06/14/14 (from the past 24 hour(s))  URINALYSIS, ROUTINE W REFLEX MICROSCOPIC     Status: Abnormal   Collection  Time    06/14/14  1:21 PM      Result Value Ref Range   Color, Urine YELLOW  YELLOW   APPearance CLEAR  CLEAR   Specific Gravity, Urine 1.010  1.005 - 1.030   pH 6.5  5.0 - 8.0   Glucose, UA NEGATIVE  NEGATIVE mg/dL   Hgb urine dipstick SMALL (*) NEGATIVE   Bilirubin Urine NEGATIVE  NEGATIVE   Ketones, ur NEGATIVE  NEGATIVE mg/dL   Protein, ur NEGATIVE  NEGATIVE mg/dL   Urobilinogen, UA 0.2  0.0 - 1.0 mg/dL   Nitrite NEGATIVE  NEGATIVE   Leukocytes, UA NEGATIVE  NEGATIVE  URINE MICROSCOPIC-ADD ON     Status: Abnormal   Collection Time    06/14/14  1:21 PM      Result Value Ref Range   Squamous Epithelial / LPF FEW (*) RARE   WBC, UA 0-2  <3 WBC/hpf   RBC / HPF 0-2  <3 RBC/hpf  POCT PREGNANCY, URINE     Status: None   Collection Time    06/14/14  2:05 PM      Result Value Ref Range   Preg Test, Ur NEGATIVE  NEGATIVE     Review of Systems  Constitutional: Negative for fever, chills and  malaise/fatigue.  HENT: Negative for nosebleeds and sore throat.   Eyes: Negative for blurred vision.  Respiratory: Negative for cough, shortness of breath and wheezing.   Cardiovascular: Negative for chest pain and palpitations.  Gastrointestinal: Positive for diarrhea (yesterday). Negative for heartburn, nausea, vomiting, abdominal pain and constipation.  Genitourinary: Negative for dysuria and frequency.  Skin: Negative for rash.  Neurological: Positive for headaches (2 days ). Negative for dizziness, loss of consciousness and weakness.  Endo/Heme/Allergies: Does not bruise/bleed easily.  Psychiatric/Behavioral: Negative for depression and substance abuse. The patient is not nervous/anxious.    Physical Exam   Blood pressure 138/71, pulse 87, temperature 98.9 F (37.2 C), temperature source Oral, resp. rate 16, height 5' 3.5" (1.613 m), weight 64.501 kg (142 lb 3.2 oz), SpO2 100.00%, not currently breastfeeding.  Physical Exam  Constitutional: She is oriented to person, place, and  time. She appears well-developed and well-nourished. No distress.  HENT:  Head: Normocephalic.  Eyes: Conjunctivae are normal.  Neck: Normal range of motion.  Cardiovascular: Regular rhythm and normal heart sounds.   Respiratory: Effort normal and breath sounds normal.  GI: Soft. Bowel sounds are normal. She exhibits no distension. There is no tenderness. There is no rebound.  Musculoskeletal: Normal range of motion.  Neurological: She is alert and oriented to person, place, and time.  Skin: Skin is warm and dry.  Psychiatric: Her mood appears anxious.   Results for orders placed during the hospital encounter of 06/14/14 (from the past 48 hour(s))  URINALYSIS, ROUTINE W REFLEX MICROSCOPIC     Status: Abnormal   Collection Time    06/14/14  1:21 PM      Result Value Ref Range   Color, Urine YELLOW  YELLOW   APPearance CLEAR  CLEAR   Specific Gravity, Urine 1.010  1.005 - 1.030   pH 6.5  5.0 - 8.0   Glucose, UA NEGATIVE  NEGATIVE mg/dL   Hgb urine dipstick SMALL (*) NEGATIVE   Bilirubin Urine NEGATIVE  NEGATIVE   Ketones, ur NEGATIVE  NEGATIVE mg/dL   Protein, ur NEGATIVE  NEGATIVE mg/dL   Urobilinogen, UA 0.2  0.0 - 1.0 mg/dL   Nitrite NEGATIVE  NEGATIVE   Leukocytes, UA NEGATIVE  NEGATIVE  URINE MICROSCOPIC-ADD ON     Status: Abnormal   Collection Time    06/14/14  1:21 PM      Result Value Ref Range   Squamous Epithelial / LPF FEW (*) RARE   WBC, UA 0-2  <3 WBC/hpf   RBC / HPF 0-2  <3 RBC/hpf  POCT PREGNANCY, URINE     Status: None   Collection Time    06/14/14  2:05 PM      Result Value Ref Range   Preg Test, Ur NEGATIVE  NEGATIVE   Comment:            THE SENSITIVITY OF THIS     METHODOLOGY IS >24 mIU/mL    MAU Course  Procedures  MDM Pt stable during MAU stay  Assessment and Plan  A: 1. Breakthrough bleeding on depo provera   2. General counselling and advice on contraception     P:   Pt discharge to home with instructions to follow up at MAU if  symptoms worsen  Pt counseled on contraception options and effectiveness with LARCs presented as most effective.  No family or personal hx of blood clots.  Pt is smoker, trying to quit.  Discussed increased risks of OCPs with smoking, but no  other risk factors, encouraged continuing to decrease amount of cigarettes daily.  norgestimate-ethinyl estradiol (ORTHO-CYCLEN,SPRINTEC,PREVIFEM) 0.25-35 MG-MCG tablet daily.   Pt provided with list of providers to establish GYN care   Rosemary HolmsHaller, Nicolas 06/14/2014, 4:05 PM   I have seen this patient and agree with the above PA student's note.  LEFTWICH-KIRBY, Khamari Yousuf Certified Nurse-Midwife

## 2014-06-14 NOTE — Discharge Instructions (Signed)
Metrorrhagia  °Metrorrhagia is uterine bleeding at irregular intervals, especially between menstrual periods.  °CAUSES  °· Dysfunctional uterine bleeding. °· Uterine lining growing outside the uterus (endometriosis). °· Embryo adhering to uterine wall (implantation). °· Pregnancy growing in the fallopian tubes (ectopic pregnancy). °· Miscarriage. °· Menopause. °· Cancer of the reproduction organs. °· Certain drugs such as hormonal contraceptives. °· Inherited bleeding disorders. °· Trauma. °· Uterine fibroids. °· Sexually transmitted diseases (STDs). °· Polycystic ovarian disease. °DIAGNOSIS °· A history will be taken. °· A physical exam will be performed. °· Other tests may include: °¨ Blood tests. °¨ A pregnancy test. °¨ An ultrasound of the abdomen and pelvis. °¨ A biopsy of the uterine lining. °¨ A MRI or CT scan of the abdomen and pelvis. °TREATMENT °Treatment will depend on the cause. °HOME CARE INSTRUCTIONS  °· Take all medicines as directed by your caregiver. Do not change or switch medicines without talking to your caregiver. °· Take all iron supplements exactly as directed by your caregiver. Iron supplements help to replace the iron your body loses from irregular bleeding. If you become constipated, increase the amount of fiber, fruits, and vegetables in your diet. °· Do not take aspirin or medicines that contain aspirin for 1 week before your menstrual period or during your menstrual period. Aspirin may increase the bleeding. °· Rest as much as possible if you change your sanitary pad or tampon more than once every 2 hours. °· Eat well-balanced meals including foods high in iron, such as green leafy vegetables, red meat, liver, eggs, and whole-grain breads and cereals. °· Do not try to lose weight until the abnormal bleeding is controlled and your blood iron level is back to normal. °SEEK MEDICAL CARE IF:  °· You have nausea and vomiting, or you cannot keep foods down. °· You feel dizzy or have diarrhea  while taking medicine. °· You have any problems that may be related to the medicine you are taking. °SEEK IMMEDIATE MEDICAL CARE IF:  °· You have a fever. °· You develop chills. °· You become lightheaded or faint. °· You need to change your sanitary pad or tampon more than once an hour. °· Your bleeding becomes heavy. °· You begin to pass clots or tissue. °MAKE SURE YOU:  °· Understand these instructions. °· Will watch your condition. °· Will get help right away if you are not doing well or get worse. °Document Released: 08/23/2005 Document Revised: 11/15/2011 Document Reviewed: 03/22/2011 °ExitCare® Patient Information ©2015 [employer]. This information is not intended to replace advice given to you by your health care provider. Make sure you discuss any questions you have with your health care provider. ° °

## 2014-06-15 NOTE — MAU Provider Note (Signed)
Attestation of Attending Supervision of Advanced Practitioner (PA/CNM/NP): Evaluation and management procedures were performed by the Advanced Practitioner under my supervision and collaboration.  I have reviewed the Advanced Practitioner's note and chart, and I agree with the management and plan.  Jacob Stinson, DO Attending Physician Faculty Practice, [facility]  

## 2014-07-08 ENCOUNTER — Encounter (HOSPITAL_COMMUNITY): Payer: Self-pay | Admitting: *Deleted

## 2016-11-17 ENCOUNTER — Inpatient Hospital Stay (HOSPITAL_COMMUNITY)
Admission: AD | Admit: 2016-11-17 | Discharge: 2016-11-17 | Discharge status: Against Medical Advice | Payer: Medicaid Other | Source: Ambulatory Visit | Attending: Obstetrics and Gynecology | Admitting: Obstetrics and Gynecology

## 2016-11-17 ENCOUNTER — Encounter (HOSPITAL_COMMUNITY): Payer: Self-pay | Admitting: *Deleted

## 2016-11-17 DIAGNOSIS — Z5321 Procedure and treatment not carried out due to patient leaving prior to being seen by health care provider: Secondary | ICD-10-CM | POA: Insufficient documentation

## 2016-11-17 LAB — URINALYSIS, ROUTINE W REFLEX MICROSCOPIC
BILIRUBIN URINE: NEGATIVE
Glucose, UA: NEGATIVE mg/dL
KETONES UR: NEGATIVE mg/dL
LEUKOCYTES UA: NEGATIVE
Nitrite: NEGATIVE
PROTEIN: NEGATIVE mg/dL
SPECIFIC GRAVITY, URINE: 1.019 (ref 1.005–1.030)
pH: 7 (ref 5.0–8.0)

## 2016-11-17 LAB — POCT PREGNANCY, URINE: PREG TEST UR: NEGATIVE

## 2016-11-17 NOTE — MAU Note (Signed)
Pt informed receptionist she was going home.

## 2016-11-17 NOTE — MAU Note (Signed)
Patient not in lobby

## 2016-11-17 NOTE — MAU Note (Signed)
Pt states her period is six days late, she used the BR this morning & something "plopped" in the toilet, unsure if it was a clot or not, had blood on toilet tissue.  Thinks she may be having a miscarriage.   Went to the BR later, was not bleeding.  Having vaginal irritation, thinks it may be a yeast infection.  Neg HPT 1-2 days ago.

## 2017-06-16 ENCOUNTER — Emergency Department (HOSPITAL_COMMUNITY)
Admission: EM | Admit: 2017-06-16 | Discharge: 2017-06-16 | Disposition: A | Discharge status: Home / Self Care | Payer: Self-pay | Attending: Emergency Medicine | Admitting: Emergency Medicine

## 2017-06-16 ENCOUNTER — Encounter (HOSPITAL_COMMUNITY): Payer: Self-pay

## 2017-06-16 DIAGNOSIS — M545 Low back pain, unspecified: Secondary | ICD-10-CM

## 2017-06-16 MED ORDER — CYCLOBENZAPRINE HCL 10 MG PO TABS: 10.0000 mg | ORAL_TABLET | Freq: Two times a day (BID) | ORAL | 0 refills | Status: DC | PRN

## 2017-06-16 NOTE — ED Triage Notes (Signed)
Pt reports back pain after moving furniture on Saturday. She reports she is unable to stand up straight. Pt ambulatory, reports some left leg tingling.

## 2017-06-16 NOTE — ED Provider Notes (Signed)
[facility]-EMERGENCY DEPT Provider Note   CSN: [id number] Arrival date & time: 06/16/17  1108     History   Chief Complaint Chief Complaint  Patient presents with  . Back Pain    HPI Kaylee Kim is a 26 y.o. female.  HPI  26 year old female presents today with complaints of back pain.  Patient reports that 5 days ago she was moving.  She notes no significant pain through the event, but notes the subsequent days developed left lower back pain.  Patient notes some pain radiating into her left buttocks, now notes that the pain is on the right side.  She denies any loss of distal sensation strength and motor function, changes in urinary or bowel habits or characteristics, fever, or any other red flags for back pain.  Patient denies any medication prior to arrival. Pt reports she is not pregnant.  Past Medical History:  Diagnosis Date  . Medical history non-contributory   . No pertinent past medical history     Patient Active Problem List   Diagnosis Date Noted  . Preterm labor 02/25/2014  . Active labor 02/23/2014    Past Surgical History:  Procedure Laterality Date  . DILATION AND CURETTAGE OF UTERUS      OB History    Gravida Para Term Preterm AB Living   3 2 0 SAB TAB Ectopic Multiple Live Births   0 1 0 0 2       Home Medications    Prior to Admission medications   Medication Sig Start Date End Date Taking? Authorizing Provider  cyclobenzaprine (FLEXERIL) 10 MG tablet Take 1 tablet (10 mg total) by mouth 2 (two) times daily as needed for muscle spasms. 06/16/17   Kasmira Cacioppo, Tinnie Gens, PA-C  ibuprofen (ADVIL,MOTRIN) 600 MG tablet Take 1 tablet (600 mg total) by mouth every 6 (six) hours as needed. 02/25/14   Gerald Leitz, MD  norgestimate-ethinyl estradiol (ORTHO-CYCLEN,SPRINTEC,PREVIFEM) 0.25-35 MG-MCG tablet Take 1 tablet by mouth daily. 06/14/14   Leftwich-Kirby, Wilmer Floor, CNM  Prenatal Vit-Fe Fumarate-FA (PRENATAL MULTIVITAMIN) TABS tablet Take 1 tablet by mouth daily  at 12 noon. 02/25/14   Gerald Leitz, MD    Family History History reviewed. No pertinent family history.  Social History Social History  Substance Use Topics  . Smoking status: Heavy Tobacco Smoker    Packs/day: 3.00    Types: Cigars  . Smokeless tobacco: Never Used  . Alcohol use No     Allergies   Patient has no known allergies.   Review of Systems Review of Systems  All other systems reviewed and are negative.    Physical Exam Updated Vital Signs BP 119/69 (BP Location: Left Arm)   Pulse 71   Temp 98.5 F (36.9 C) (Oral)   Resp 16   LMP 05/21/2017 (Exact Date)   SpO2 98%   Physical Exam  Constitutional: She is oriented to person, place, and time. She appears well-developed and well-nourished. No distress.  HENT:  Head: Normocephalic.  Neck: Normal range of motion. Neck supple.  Pulmonary/Chest: Effort normal.  Musculoskeletal: Normal range of motion. She exhibits tenderness. She exhibits no edema.  No C, T, or L spine tenderness to palpation. No obvious signs of trauma, deformity, infection, step-offs. Lung expansion normal. No scoliosis or kyphosis. Bilateral lower extremity strength 5 out of 5, sensation grossly intact  TTP right lower lumbar musculature and soft tissue      Neurological: She is alert and oriented to person, place, and  time.  Skin: Skin is warm and dry. She is not diaphoretic.  Psychiatric: She has a normal mood and affect. Her behavior is normal. Judgment and thought content normal.  Nursing note and vitals reviewed.    ED Treatments / Results  Labs (all labs ordered are listed, but only abnormal results are displayed) Labs Reviewed - No data to display  EKG  EKG Interpretation None       Radiology No results found.  Procedures Procedures (including critical care time)  Medications Ordered in ED Medications - No data to display   Initial Impression / Assessment and Plan / ED Course  I have reviewed the triage vital  signs and the nursing notes.  Pertinent labs & imaging results that were available during my care of the patient were reviewed by me and considered in my medical decision making (see chart for details).      Final Clinical Impressions(s) / ED Diagnoses   Final diagnoses:  Acute right-sided low back pain without sciatica    Labs:   Imaging:  Consults:  Therapeutics:  Discharge Meds:   Assessment/Plan: 26 year old female presents today with likely muscular back pain.  No red flags on exam, no midline tenderness, well-appearing in no acute distress.  Symptom Medicare instructions given strict return precautions given.  She verbalized understanding and agreement to today's plan.      New Prescriptions New Prescriptions   CYCLOBENZAPRINE (FLEXERIL) 10 MG TABLET    Take 1 tablet (10 mg total) by mouth 2 (two) times daily as needed for muscle spasms.     Eyvonne Mechanic, PA-C 06/16/17 1310    Pricilla Loveless, MD 06/17/17 [phone]

## 2017-06-16 NOTE — Discharge Instructions (Signed)
Please read attached information. If you experience any new or worsening signs or symptoms please return to the emergency room for evaluation. Please follow-up with your primary care provider or specialist as discussed. Please use medication prescribed only as directed and discontinue taking if you have any concerning signs or symptoms.   °

## 2018-04-18 ENCOUNTER — Emergency Department (HOSPITAL_COMMUNITY): Payer: Self-pay

## 2018-04-18 ENCOUNTER — Encounter (HOSPITAL_COMMUNITY): Payer: Self-pay | Admitting: *Deleted

## 2018-04-18 ENCOUNTER — Emergency Department (HOSPITAL_COMMUNITY)
Admission: EM | Admit: 2018-04-18 | Discharge: 2018-04-18 | Disposition: A | Discharge status: Home / Self Care | Payer: Self-pay | Attending: Emergency Medicine | Admitting: Emergency Medicine

## 2018-04-18 DIAGNOSIS — F17290 Nicotine dependence, other tobacco product, uncomplicated: Secondary | ICD-10-CM | POA: Insufficient documentation

## 2018-04-18 DIAGNOSIS — B9689 Other specified bacterial agents as the cause of diseases classified elsewhere: Secondary | ICD-10-CM

## 2018-04-18 DIAGNOSIS — N760 Acute vaginitis: Secondary | ICD-10-CM | POA: Insufficient documentation

## 2018-04-18 DIAGNOSIS — Z79899 Other long term (current) drug therapy: Secondary | ICD-10-CM | POA: Insufficient documentation

## 2018-04-18 DIAGNOSIS — R1084 Generalized abdominal pain: Secondary | ICD-10-CM | POA: Insufficient documentation

## 2018-04-18 LAB — URINALYSIS, ROUTINE W REFLEX MICROSCOPIC
Bacteria, UA: NONE SEEN
Bilirubin Urine: NEGATIVE
GLUCOSE, UA: NEGATIVE mg/dL
KETONES UR: NEGATIVE mg/dL
Leukocytes, UA: NEGATIVE
Nitrite: NEGATIVE
PH: 6 (ref 5.0–8.0)
Protein, ur: NEGATIVE mg/dL
Specific Gravity, Urine: 1.019 (ref 1.005–1.030)

## 2018-04-18 LAB — COMPREHENSIVE METABOLIC PANEL
ALK PHOS: 87 U/L (ref 38–126)
ALT: 9 U/L (ref 0–44)
AST: 13 U/L — AB (ref 15–41)
Albumin: 3.7 g/dL (ref 3.5–5.0)
Anion gap: 10 (ref 5–15)
BUN: 8 mg/dL (ref 6–20)
CALCIUM: 8.9 mg/dL (ref 8.9–10.3)
CO2: 23 mmol/L (ref 22–32)
CREATININE: 0.74 mg/dL (ref 0.44–1.00)
Chloride: 106 mmol/L (ref 98–111)
GFR calc non Af Amer: >60 mL/min (ref >60)
Glucose, Bld: 88 mg/dL (ref 70–99)
Potassium: 3.7 mmol/L (ref 3.5–5.1)
SODIUM: 139 mmol/L (ref 135–145)
Total Bilirubin: 0.4 mg/dL (ref 0.3–1.2)
Total Protein: 6.8 g/dL (ref 6.5–8.1)

## 2018-04-18 LAB — WET PREP, GENITAL
Sperm: NONE SEEN
Trich, Wet Prep: NONE SEEN
Yeast Wet Prep HPF POC: NONE SEEN

## 2018-04-18 LAB — CBC
HCT: 44 % (ref 36.0–46.0)
Hemoglobin: 14.5 g/dL (ref 12.0–15.0)
MCH: 29.1 pg (ref 26.0–34.0)
MCHC: 33 g/dL (ref 30.0–36.0)
MCV: 88.4 fL (ref 78.0–100.0)
PLATELETS: 278 10*3/uL (ref 150–400)
RBC: 4.98 MIL/uL (ref 3.87–5.11)
RDW: 13.3 % (ref 11.5–15.5)
WBC: 4.8 10*3/uL (ref 4.0–10.5)

## 2018-04-18 LAB — I-STAT BETA HCG BLOOD, ED (MC, WL, AP ONLY): I-stat hCG, quantitative: <5 m[IU]/mL (ref <5)

## 2018-04-18 LAB — I-STAT CG4 LACTIC ACID, ED: LACTIC ACID, VENOUS: 1.01 mmol/L (ref 0.5–1.9)

## 2018-04-18 LAB — LIPASE, BLOOD: LIPASE: 32 U/L (ref 11–51)

## 2018-04-18 MED ORDER — SODIUM CHLORIDE 0.9 % IV BOLUS
1000.0000 mL | Freq: Once | INTRAVENOUS | Status: AC
  Administered 2018-04-18: 1000 mL via INTRAVENOUS

## 2018-04-18 MED ORDER — METRONIDAZOLE 500 MG PO TABS: 500.0000 mg | ORAL_TABLET | Freq: Two times a day (BID) | ORAL | 0 refills | Status: AC

## 2018-04-18 MED ORDER — HYDROCODONE-ACETAMINOPHEN 5-325 MG PO TABS: 1.0000 | ORAL_TABLET | ORAL | 0 refills | Status: DC | PRN

## 2018-04-18 MED ORDER — MORPHINE SULFATE (PF) 4 MG/ML IV SOLN
4.0000 mg | Freq: Once | INTRAVENOUS | Status: AC
  Administered 2018-04-18: 4 mg via INTRAVENOUS
  Filled 2018-04-18: qty 1

## 2018-04-18 MED ORDER — ONDANSETRON HCL 4 MG PO TABS: 4.0000 mg | ORAL_TABLET | Freq: Three times a day (TID) | ORAL | 0 refills | Status: AC | PRN

## 2018-04-18 MED ORDER — ONDANSETRON HCL 4 MG/2ML IJ SOLN
4.0000 mg | Freq: Once | INTRAMUSCULAR | Status: AC
  Administered 2018-04-18: 4 mg via INTRAVENOUS
  Filled 2018-04-18: qty 2

## 2018-04-18 NOTE — ED Notes (Signed)
EDP at bedside. Tegg[place]

## 2018-04-18 NOTE — ED Triage Notes (Signed)
Pt in c/o generalized abdominal pain with n/v for the last two weeks, also body aches and constipation, negative home pregnancy test, no distress noted

## 2018-04-18 NOTE — ED Notes (Signed)
Pt is ambulatory to restroom.  Provided Urine and sent to lab

## 2018-04-18 NOTE — ED Notes (Signed)
Patient transported to Ultrasound 

## 2018-04-18 NOTE — Discharge Instructions (Signed)
Your laboratory testing today was overall reassuring aside from the bacterial vaginosis we discovered.  Your ultrasound was reassuring.  Given your resolution of symptoms, our shared decision making conversation led us to decide not to pursue CT imaging at this time.  Please follow-up with a primary care physician in several days.  If any symptoms change or worsen, please return to the nearest emergency department.

## 2018-04-18 NOTE — ED Provider Notes (Signed)
[facility] EMERGENCY DEPARTMENT Provider Note   CSN: [id number] Arrival date & time: 04/18/18  1007     History   Chief Complaint Chief Complaint  Patient presents with  . Abdominal Pain    HPI Kaylee Kim is a 27 y.o. female.  The history is provided by the patient and medical records. No language interpreter was used.  Abdominal Pain   This is a new problem. The current episode started more than 1 week ago. The problem occurs constantly. The problem has not changed since onset.The pain is associated with an unknown factor. The pain is located in the RLQ. The pain is at a severity of 9/10. The pain is moderate. Associated symptoms include nausea, vomiting and constipation. Pertinent negatives include fever, diarrhea, dysuria, frequency, hematuria and headaches. The symptoms are aggravated by palpation. Nothing relieves the symptoms.    Past Medical History:  Diagnosis Date  . Medical history non-contributory   . No pertinent past medical history     Patient Active Problem List   Diagnosis Date Noted  . Preterm labor 02/25/2014  . Active labor 02/23/2014    Past Surgical History:  Procedure Laterality Date  . DILATION AND CURETTAGE OF UTERUS       OB History    Gravida  3   Para  2   Term  0   Preterm  2   AB  1   Living  2     SAB  0   TAB  1   Ectopic  0   Multiple  0   Live Births  2            Home Medications    Prior to Admission medications   Medication Sig Start Date End Date Taking? Authorizing Provider  cyclobenzaprine (FLEXERIL) 10 MG tablet Take 1 tablet (10 mg total) by mouth 2 (two) times daily as needed for muscle spasms. 06/16/17   Hedges, Tinnie Gens, PA-C  ibuprofen (ADVIL,MOTRIN) 600 MG tablet Take 1 tablet (600 mg total) by mouth every 6 (six) hours as needed. 02/25/14   Gerald Leitz, MD  norgestimate-ethinyl estradiol (ORTHO-CYCLEN,SPRINTEC,PREVIFEM) 0.25-35 MG-MCG tablet Take 1 tablet by mouth daily. 06/14/14    Leftwich-Kirby, Wilmer Floor, CNM  Prenatal Vit-Fe Fumarate-FA (PRENATAL MULTIVITAMIN) TABS tablet Take 1 tablet by mouth daily at 12 noon. 02/25/14   Gerald Leitz, MD    Family History History reviewed. No pertinent family history.  Social History Social History   Tobacco Use  . Smoking status: Heavy Tobacco Smoker    Packs/day: 3.00    Types: Cigars  . Smokeless tobacco: Never Used  Substance Use Topics  . Alcohol use: No  . Drug use: No     Allergies   Patient has no known allergies.   Review of Systems Review of Systems  Constitutional: Negative for chills, diaphoresis, fatigue and fever.  HENT: Negative for congestion.   Respiratory: Negative for cough, chest tightness, shortness of breath and wheezing.   Cardiovascular: Negative for chest pain and palpitations.  Gastrointestinal: Positive for abdominal pain, constipation, nausea and vomiting. Negative for diarrhea.  Genitourinary: Positive for flank pain and vaginal bleeding (resolved yesterday). Negative for decreased urine volume, dysuria, frequency, hematuria and vaginal discharge.  Musculoskeletal: Positive for back pain. Negative for neck pain and neck stiffness.  Skin: Negative for rash and wound.  Neurological: Negative for light-headedness and headaches.  Psychiatric/Behavioral: Negative for agitation.  All other systems reviewed and are negative.    Physical Exam  Updated Vital Signs BP 140/81 (BP Location: Right Arm)   Pulse 85   Temp 98.4 F (36.9 C) (Oral)   Resp 16   SpO2 98%   Physical Exam  Constitutional: She is oriented to person, place, and time. She appears well-developed and well-nourished.  Non-toxic appearance. She does not appear ill. No distress.  HENT:  Head: Normocephalic and atraumatic.  Mouth/Throat: Oropharynx is clear and moist. No oropharyngeal exudate.  Eyes: Pupils are equal, round, and reactive to light. Conjunctivae and EOM are normal.  Neck: Normal range of motion. Neck supple.   Cardiovascular: Normal rate and regular rhythm.  No murmur heard. Pulmonary/Chest: Effort normal and breath sounds normal. No respiratory distress. She has no wheezes. She exhibits no tenderness.  Abdominal: Soft. Normal appearance. She exhibits no distension. There is tenderness. There is no rigidity, no rebound, no guarding and no CVA tenderness.    Musculoskeletal: She exhibits no edema or tenderness.  Neurological: She is alert and oriented to person, place, and time.  Skin: Skin is warm and dry. Capillary refill takes less than 2 seconds. No rash noted. She is not diaphoretic. No erythema.  Psychiatric: She has a normal mood and affect.  Nursing note and vitals reviewed.    ED Treatments / Results  Labs (all labs ordered are listed, but only abnormal results are displayed) Labs Reviewed  WET PREP, GENITAL - Abnormal; Notable for the following components:      Result Value   Clue Cells Wet Prep HPF POC PRESENT (*)    WBC, Wet Prep HPF POC FEW (*)    All other components within normal limits  COMPREHENSIVE METABOLIC PANEL - Abnormal; Notable for the following components:   AST 13 (*)    All other components within normal limits  URINALYSIS, ROUTINE W REFLEX MICROSCOPIC - Abnormal; Notable for the following components:   Hgb urine dipstick SMALL (*)    All other components within normal limits  LIPASE, BLOOD  CBC  I-STAT BETA HCG BLOOD, ED ([facility], [facility], [facility] ONLY)  I-STAT CG4 LACTIC ACID, ED  WET PREP  (BD AFFIRM) ([place])  GC/CHLAMYDIA PROBE AMP ([place]) NOT AT [facility]    EKG None  Radiology [place] Transvaginal Non-ob  Result Date: 04/18/2018 CLINICAL DATA:  Right adnexal region tenderness EXAM: TRANSABDOMINAL AND TRANSVAGINAL ULTRASOUND OF PELVIS DOPPLER ULTRASOUND OF OVARIES TECHNIQUE: Study was performed transabdominally to optimize pelvic field of view evaluation and transvaginally to optimize internal visceral architecture evaluation. Color and duplex Doppler  ultrasound was utilized to evaluate blood flow to the ovaries. COMPARISON:  None. FINDINGS: Uterus Measurements: 7.5 x 4.5 x 5.0 cm. No fibroids or other mass visualized. Endometrium Thickness: 8 mm in midportion. There appears to be an arcuate type configuration with widening of both horns in concert proximally. The larger of these areas measures 10 mm. Endometrial contour is smooth. Right ovary Measurements: 4.1 x 2.6 x 3.6 cm. Normal appearance/no adnexal mass. Physiologic follicles are noted. Left ovary Measurements: 4.6 x 2.8 x 2.6 cm. Normal appearance/no adnexal mass. Physiologic follicles are noted. Pulsed Doppler evaluation of both ovaries demonstrates normal low-resistance arterial and venous waveforms. Other findings No abnormal free fluid. IMPRESSION: 1. 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 Endometrium is felt to overall may be within normal limits allowing for an arcuate configuration superiorly. 2.  No intrauterine mass. 3. Ovaries normal in size and configuration bilaterally. Doppler signal noted in each  ovary. No demonstrable ovarian torsion. 4.  No evident free pelvic fluid. Electronically Signed   By: Bretta Bang III M.D.   On: 04/18/2018 15:23   US Pelvis Complete  Result Date: 04/18/2018 CLINICAL DATA:  Right adnexal region tenderness EXAM: TRANSABDOMINAL AND TRANSVAGINAL ULTRASOUND OF PELVIS DOPPLER ULTRASOUND OF OVARIES TECHNIQUE: Study was performed transabdominally to optimize pelvic field of view evaluation and transvaginally to optimize internal visceral architecture evaluation. Color and duplex Doppler ultrasound was utilized to evaluate blood flow to the ovaries. COMPARISON:  None. FINDINGS: Uterus Measurements: 7.5 x 4.5 x 5.0 cm. No fibroids or other mass visualized. Endometrium Thickness: 8 mm in midportion. There appears to be an arcuate type configuration with widening of both  horns in concert proximally. The larger of these areas measures 10 mm. Endometrial contour is smooth. Right ovary Measurements: 4.1 x 2.6 x 3.6 cm. Normal appearance/no adnexal mass. Physiologic follicles are noted. Left ovary Measurements: 4.6 x 2.8 x 2.6 cm. Normal appearance/no adnexal mass. Physiologic follicles are noted. Pulsed Doppler evaluation of both ovaries demonstrates normal low-resistance arterial and venous waveforms. Other findings No abnormal free fluid. IMPRESSION: 1. 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 Endometrium is felt to overall may be within normal limits allowing for an arcuate configuration superiorly. 2.  No intrauterine mass. 3. Ovaries normal in size and configuration bilaterally. Doppler signal noted in each ovary. No demonstrable ovarian torsion. 4.  No evident free pelvic fluid. Electronically Signed   By: Bretta Bang III M.D.   On: 04/18/2018 15:23   [place] Art/ven Flow Abd Pelv Doppler  Result Date: 04/18/2018 CLINICAL DATA:  Right adnexal region tenderness EXAM: TRANSABDOMINAL AND TRANSVAGINAL ULTRASOUND OF PELVIS DOPPLER ULTRASOUND OF OVARIES TECHNIQUE: Study was performed transabdominally to optimize pelvic field of view evaluation and transvaginally to optimize internal visceral architecture evaluation. Color and duplex Doppler ultrasound was utilized to evaluate blood flow to the ovaries. COMPARISON:  None. FINDINGS: Uterus Measurements: 7.5 x 4.5 x 5.0 cm. No fibroids or other mass visualized. Endometrium Thickness: 8 mm in midportion. There appears to be an arcuate type configuration with widening of both horns in concert proximally. The larger of these areas measures 10 mm. Endometrial contour is smooth. Right ovary Measurements: 4.1 x 2.6 x 3.6 cm. Normal appearance/no adnexal mass. Physiologic follicles are noted. Left ovary Measurements: 4.6 x 2.8 x 2.6 cm. Normal  appearance/no adnexal mass. Physiologic follicles are noted. Pulsed Doppler evaluation of both ovaries demonstrates normal low-resistance arterial and venous waveforms. Other findings No abnormal free fluid. IMPRESSION: 1. 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 Endometrium is felt to overall may be within normal limits allowing for an arcuate configuration superiorly. 2.  No intrauterine mass. 3. Ovaries normal in size and configuration bilaterally. Doppler signal noted in each ovary. No demonstrable ovarian torsion. 4.  No evident free pelvic fluid. Electronically Signed   By: Bretta Bang III M.D.   On: 04/18/2018 15:23    Procedures Procedures (including critical care time)  Medications Ordered in ED Medications  sodium chloride 0.9 % bolus 1,000 mL (0 mLs Intravenous Stopped 04/18/18 1429)  ondansetron (ZOFRAN) injection 4 mg (4 mg Intravenous Given 04/18/18 1328)  morphine 4 MG/ML injection 4 mg (4 mg Intravenous Given 04/18/18 1328)     Initial Impression / Assessment and Plan / ED Course  I have reviewed the  triage vital signs and the nursing notes.  Pertinent labs & imaging results that were available during my care of the patient were reviewed by me and considered in my medical decision making (see chart for details).     Kaylee Kim is a 27 y.o. female with a past medical history significant for 2 prior abortions who presents with abdominal pain, nausea and vomiting.  Patient reports that for the last 2 weeks she has had diffuse abdominal pain.  She reports it comes from her right back radiating around her right flank towards her right abdomen.  She reports the pain is diffuse all over her abdomen including her lower abdomen.  She reports that she had an irregular menstrual cycle several days ago that finished yesterday.  She denies vaginal discharge.  She reports that she has had decreased  oral intake and is been dry heaving for the last 2 weeks.  She says she took a pregnancy test several days ago that was negative.  She denies any diarrhea but does report constipation.  Her last bowel movement was 2 days ago and it was very small.  She reports her abdominal pain gets up to 9 out of 10 but is currently more improved.  She does report continued nausea.  She reports no history of kidney stone, pyelonephritis, ovarian torsion, or appendicitis.  She denies history of abdominal surgeries.  She reports that she is taking Pepcid and Tums without relief.  She denies other complaints on arrival.  On exam, patient has mild abdominal tenderness in the lower abdomen.  No significant CVA tenderness or flank tenderness.  Lungs clear chest nontender.  Given patient's recent menstrual cycle and the lower abdominal pain, patient will have pelvic exam to feel for adnexal tenderness.  If adnexa is tender, anticipate ultrasound however if that is negative, patient will likely need CT scan to look for etiology of symptoms.  Patient will have laboratory testing and urinalysis to further evaluate.  She will given symptomatic relief medications and fluids for rehydration.  Anticipate reassessment after work-up.   3:47 PM Patient's laboratory testing reveals evidence of bacterial vaginosis with clue cells.  Urinalysis otherwise did not show evidence of infection.  Small amount of hemoglobin likely due to her menstrual cycle.  Lactic is not elevated, pregnancy is negative.  CBC and CMP otherwise reassuring.  Pelvic ultrasound did not show evidence of torsion or other significant pelvic ab normality.  On my reassessment after work-up, patient's symptoms had resolved.  We had a shared decision making conversation as to necessity of doing a CT scan.  Given the patient's reassuring lab testing, do not feel patient needs the exposure to radiation at this time.  Patient thinks this may be due to constipation and her  recent menstrual cycle.  Patient will give prescription for pain medication and nausea medication and the Flagyl for BV.  Patient will follow-up with her PCP in the next several days and understood strict return precautions.  Patient returns with worsened abdominal pain, patient will likely need to have CT abdomen pelvis to rule out appendicitis or other intra-abdominal pathology however given the resolution of symptoms and reassuring vital signs, we agreed that she did not have to have this done today.  Next  Patient had no other worsens or concerns and understood plan of care.  Patient discharged in good condition.  Final Clinical Impressions(s) / ED Diagnoses   Final diagnoses:  Generalized abdominal pain  Bacterial vaginosis    ED Discharge  Orders         Ordered    HYDROcodone-acetaminophen (NORCO/VICODIN) 5-325 MG tablet  Every 4 hours PRN     04/18/18 1551    ondansetron (ZOFRAN) 4 MG tablet  Every 8 hours PRN     04/18/18 1551    metroNIDAZOLE (FLAGYL) 500 MG tablet  2 times daily     04/18/18 1551         Clinical Impression: 1. Generalized abdominal pain   2. Bacterial vaginosis     Disposition: Discharge  Condition: Good  I have discussed the results, Dx and Tx plan with the pt(& family if present). He/she/they expressed understanding and agree(s) with the plan. Discharge instructions discussed at great length. Strict return precautions discussed and pt &/or family have verbalized understanding of the instructions. No further questions at time of discharge.    New Prescriptions   HYDROCODONE-ACETAMINOPHEN (NORCO/VICODIN) 5-325 MG TABLET    Take 1 tablet by mouth every 4 (four) hours as needed for up to 10 doses.   METRONIDAZOLE (FLAGYL) 500 MG TABLET    Take 1 tablet (500 mg total) by mouth 2 (two) times daily for 7 days.   ONDANSETRON (ZOFRAN) 4 MG TABLET    Take 1 tablet (4 mg total) by mouth every 8 (eight) hours as needed for up to 12 days for nausea or  vomiting.    [facility] [address] [id number] [place] [id number] [phone]    [facility] [address] [place] [id number] [phone] Schedule an appointment as soon as possible for a visit        Season Astacio, Canary Brimhristopher J, MD 04/18/18 1609

## 2018-04-19 LAB — GC/CHLAMYDIA PROBE AMP (~~LOC~~) NOT AT ARMC
CHLAMYDIA, DNA PROBE: NEGATIVE
Neisseria Gonorrhea: NEGATIVE

## 2018-08-19 ENCOUNTER — Encounter (HOSPITAL_COMMUNITY): Payer: Self-pay

## 2018-08-19 ENCOUNTER — Emergency Department (HOSPITAL_COMMUNITY)
Admission: EM | Admit: 2018-08-19 | Discharge: 2018-08-19 | Disposition: A | Discharge status: Home / Self Care | Payer: Self-pay | Attending: Emergency Medicine | Admitting: Emergency Medicine

## 2018-08-19 ENCOUNTER — Emergency Department (HOSPITAL_COMMUNITY): Payer: Self-pay

## 2018-08-19 ENCOUNTER — Other Ambulatory Visit: Payer: Self-pay

## 2018-08-19 DIAGNOSIS — S61012A Laceration without foreign body of left thumb without damage to nail, initial encounter: Secondary | ICD-10-CM | POA: Insufficient documentation

## 2018-08-19 DIAGNOSIS — W231XXA Caught, crushed, jammed, or pinched between stationary objects, initial encounter: Secondary | ICD-10-CM | POA: Insufficient documentation

## 2018-08-19 DIAGNOSIS — Y939 Activity, unspecified: Secondary | ICD-10-CM | POA: Insufficient documentation

## 2018-08-19 DIAGNOSIS — Y999 Unspecified external cause status: Secondary | ICD-10-CM | POA: Insufficient documentation

## 2018-08-19 DIAGNOSIS — F17200 Nicotine dependence, unspecified, uncomplicated: Secondary | ICD-10-CM | POA: Insufficient documentation

## 2018-08-19 DIAGNOSIS — Y929 Unspecified place or not applicable: Secondary | ICD-10-CM | POA: Insufficient documentation

## 2018-08-19 DIAGNOSIS — Z79899 Other long term (current) drug therapy: Secondary | ICD-10-CM | POA: Insufficient documentation

## 2018-08-19 DIAGNOSIS — Z23 Encounter for immunization: Secondary | ICD-10-CM | POA: Insufficient documentation

## 2018-08-19 MED ORDER — NAPROXEN 375 MG PO TABS: 375.0000 mg | ORAL_TABLET | Freq: Two times a day (BID) | ORAL | 0 refills | Status: DC

## 2018-08-19 MED ORDER — BUPIVACAINE HCL (PF) 0.25 % IJ SOLN
10.0000 mL | Freq: Once | INTRAMUSCULAR | Status: AC
  Administered 2018-08-19: 10 mL
  Filled 2018-08-19: qty 30

## 2018-08-19 MED ORDER — IBUPROFEN 200 MG PO TABS
600.0000 mg | ORAL_TABLET | Freq: Once | ORAL | Status: AC
  Administered 2018-08-19: 600 mg via ORAL
  Filled 2018-08-19: qty 3

## 2018-08-19 MED ORDER — LIDOCAINE HCL (PF) 1 % IJ SOLN
5.0000 mL | Freq: Once | INTRAMUSCULAR | Status: AC
  Administered 2018-08-19: 5 mL
  Filled 2018-08-19: qty 30

## 2018-08-19 MED ORDER — TETANUS-DIPHTH-ACELL PERTUSSIS 5-2.5-18.5 LF-MCG/0.5 IM SUSP
0.5000 mL | Freq: Once | INTRAMUSCULAR | Status: AC
  Administered 2018-08-19: 0.5 mL via INTRAMUSCULAR
  Filled 2018-08-19: qty 0.5

## 2018-08-19 MED ORDER — CEPHALEXIN 500 MG PO CAPS: 500.0000 mg | ORAL_CAPSULE | Freq: Four times a day (QID) | ORAL | 0 refills | Status: DC

## 2018-08-19 MED ORDER — CEPHALEXIN 500 MG PO CAPS
500.0000 mg | ORAL_CAPSULE | Freq: Once | ORAL | Status: AC
  Administered 2018-08-19: 500 mg via ORAL
  Filled 2018-08-19: qty 1

## 2018-08-19 NOTE — ED Provider Notes (Signed)
[facility]-EMERGENCY DEPT Provider Note   CSN: [id number] Arrival date & time: 08/19/18  1024     History   Chief Complaint Chief Complaint  Patient presents with  . Finger Injury    HPI Kaylee Kim is a 27 y.o. female who presents to the ED with c/o left thumb pain. Patient reports catching her left thumb in the door this morning. Laceration to the thumb with pain 9/10.  HPI  Past Medical History:  Diagnosis Date  . Medical history non-contributory   . No pertinent past medical history     Patient Active Problem List   Diagnosis Date Noted  . Preterm labor 02/25/2014  . Active labor 02/23/2014    Past Surgical History:  Procedure Laterality Date  . DILATION AND CURETTAGE OF UTERUS       OB History    Gravida  3   Para  2   Term  0   Preterm  2   AB  1   Living  2     SAB  0   TAB  1   Ectopic  0   Multiple  0   Live Births  2            Home Medications    Prior to Admission medications   Medication Sig Start Date End Date Taking? Authorizing Provider  acetaminophen (TYLENOL) 500 MG tablet Take 1,500 mg by mouth every 6 (six) hours as needed for mild pain.    [provider]  BIOTIN PO Take 2 capsules by mouth daily.    [provider]  cephALEXin (KEFLEX) 500 MG capsule Take 1 capsule (500 mg total) by mouth 4 (four) times daily. 08/19/18   Janne Napoleon, NP  cetirizine (ZYRTEC) 10 MG tablet Take 10 mg by mouth daily.    [provider]  cyclobenzaprine (FLEXERIL) 10 MG tablet Take 1 tablet (10 mg total) by mouth 2 (two) times daily as needed for muscle spasms. Patient not taking: Reported on 04/18/2018 06/16/17   Eyvonne Mechanic, PA-C  HYDROcodone-acetaminophen (NORCO/VICODIN) 5-325 MG tablet Take 1 tablet by mouth every 4 (four) hours as needed for up to 10 doses. 04/18/18   Tegeler, Canary Brim, MD  naproxen (NAPROSYN) 375 MG tablet Take 1 tablet (375 mg total) by mouth 2 (two) times  daily. 08/19/18   Janne Napoleon, NP  norgestimate-ethinyl estradiol (ORTHO-CYCLEN,SPRINTEC,PREVIFEM) 0.25-35 MG-MCG tablet Take 1 tablet by mouth daily. Patient not taking: Reported on 04/18/2018 06/14/14   Hurshel Party, CNM  Prenatal Vit-Fe Fumarate-FA (PRENATAL MULTIVITAMIN) TABS tablet Take 1 tablet by mouth daily at 12 noon. Patient not taking: Reported on 04/18/2018 02/25/14   Gerald Leitz, MD    Family History No family history on file.  Social History Social History   Tobacco Use  . Smoking status: Heavy Tobacco Smoker    Packs/day: 3.00    Types: Cigars  . Smokeless tobacco: Never Used  Substance Use Topics  . Alcohol use: No  . Drug use: No     Allergies   Patient has no known allergies.   Review of Systems Review of Systems  Skin: Positive for color change and wound.  All other systems reviewed and are negative.    Physical Exam Updated Vital Signs BP 136/80 (BP Location: Right Arm)   Pulse 86   Temp 99.1 F (37.3 C) (Oral)   Resp 20   Ht 5\' 3"  (1.6 m)   Wt 79.4 kg  LMP 08/11/2018   SpO2 100%   BMI 31.00 kg/m   Physical Exam Vitals signs and nursing note reviewed.  Constitutional:      General: She is not in acute distress.    Appearance: She is well-developed.  HENT:     Head: Normocephalic.     Mouth/Throat:     Mouth: Mucous membranes are moist.  Neck:     Musculoskeletal: Neck supple.  Cardiovascular:     Rate and Rhythm: Normal rate.  Pulmonary:     Effort: Pulmonary effort is normal.  Musculoskeletal: Normal range of motion.     Left hand: She exhibits tenderness and laceration. She exhibits normal range of motion and no deformity. Normal sensation noted. Normal strength noted.     Comments: Deep laceration to the left thumb exposing the nail bed. Partial avulsion of the wound.   Skin:    General: Skin is warm and dry.     Comments: Laceration left thumb  Neurological:     Mental Status: She is alert.  Psychiatric:         Mood and Affect: Mood normal.      ED Treatments / Results  Labs (all labs ordered are listed, but only abnormal results are displayed) Labs Reviewed - No data to display  Radiology Dg Finger Thumb Left  Result Date: 08/19/2018 CLINICAL DATA:  Pain after trauma.  Deep laceration. EXAM: LEFT THUMB 2+V COMPARISON:  None. FINDINGS: Evaluation of fine cortical detail is limited due to skin dressings. No radiopaque foreign body identified. No fracture noted. IMPRESSION: No foreign body or fracture identified. Electronically Signed   By: Gerome Sam III M.D   On: 08/19/2018 11:47    Procedures .Marland KitchenLaceration Repair Date/Time: 08/19/2018 1:59 PM Performed by: Janne Napoleon, NP Authorized by: Janne Napoleon, NP   Consent:    Consent obtained:  Verbal   Consent given by:  Patient   Risks discussed:  Infection, pain and poor wound healing Anesthesia (see MAR for exact dosages):    Anesthesia method:  Nerve block   Block needle gauge:  25 G   Block anesthetic:  Lidocaine 1% w/o epi and bupivacaine 0.25% WITH epi   Block injection procedure:  Anatomic landmarks identified, introduced needle and negative aspiration for blood   Block outcome:  Anesthesia achieved Laceration details:    Location:  Finger   Finger location:  L thumb   Length (cm):  3 Repair type:    Repair type:  Simple Pre-procedure details:    Preparation:  Patient was prepped and draped in usual sterile fashion and imaging obtained to evaluate for foreign bodies Exploration:    Hemostasis achieved with:  Direct pressure   Wound exploration: entire depth of wound probed and visualized   Treatment:    Area cleansed with:  Betadine and soap and water   Amount of cleaning:  Extensive   Irrigation solution:  Sterile saline   Irrigation method:  Syringe Skin repair:    Repair method:  Sutures   Suture size:  4-0   Suture material:  Fast-absorbing gut   Suture technique:  Simple interrupted   Number of sutures:   5 Approximation:    Approximation:  Loose Post-procedure details:    Dressing:  Antibiotic ointment, non-adherent dressing, splint for protection and sterile dressing   Patient tolerance of procedure:  Tolerated well, no immediate complications Comments:     Tetanus updated   (including critical care time)  Medications Ordered in ED  Medications  cephALEXin (KEFLEX) capsule 500 mg (has no administration in time range)  ibuprofen (ADVIL,MOTRIN) tablet 600 mg (has no administration in time range)  bupivacaine (PF) (MARCAINE) 0.25 % injection 10 mL (10 mLs Infiltration Given 08/19/18 1241)  Tdap (BOOSTRIX) injection 0.5 mL (0.5 mLs Intramuscular Given 08/19/18 1242)  lidocaine (PF) (XYLOCAINE) 1 % injection 5 mL (5 mLs Infiltration Given 08/19/18 1241)     Initial Impression / Assessment and Plan / ED Course  I have reviewed the triage vital signs and the nursing notes. 27 y.o. female here with laceration of the left thumb stable for d/c without focal neuro deficits. Would closed and dressing and splint applied. Antibiotics started and return precautions discussed. Patient agrees with plan.   Final Clinical Impressions(s) / ED Diagnoses   Final diagnoses:  Laceration of left thumb without foreign body, nail damage status unspecified, initial encounter    ED Discharge Orders         Ordered    naproxen (NAPROSYN) 375 MG tablet  2 times daily     08/19/18 1403    cephALEXin (KEFLEX) 500 MG capsule  4 times daily     08/19/18 1404           Kerrie Buffaloeese, Hope NachesM, [place]NP 08/19/18 1409    Azalia Bilisampos, Kevin, MD 08/20/18 [phone]

## 2018-08-19 NOTE — Discharge Instructions (Addendum)
The sutures will absorb. Keep the wound clean and dry for the next 2 days. Then follow the sutured wound care instructions.

## 2018-08-19 NOTE — ED Triage Notes (Signed)
Pt reports that she caught her  left hand thumb  finger in the door this morning. Deep laceration is noted bleeding has been controlled.  Pt reports 9/10 pain. Pt escorted with significant other.

## 2019-08-28 ENCOUNTER — Inpatient Hospital Stay (HOSPITAL_COMMUNITY): Payer: Medicaid Other

## 2019-08-28 ENCOUNTER — Encounter (HOSPITAL_COMMUNITY): Payer: Self-pay | Admitting: Obstetrics and Gynecology

## 2019-08-28 ENCOUNTER — Other Ambulatory Visit: Payer: Self-pay

## 2019-08-28 ENCOUNTER — Inpatient Hospital Stay (HOSPITAL_COMMUNITY)
Admission: AD | Admit: 2019-08-28 | Discharge: 2019-08-28 | Disposition: A | Discharge status: Home / Self Care | Payer: Medicaid Other | Attending: Obstetrics and Gynecology | Admitting: Obstetrics and Gynecology

## 2019-08-28 DIAGNOSIS — R000 Tachycardia, unspecified: Secondary | ICD-10-CM | POA: Diagnosis not present

## 2019-08-28 DIAGNOSIS — O99331 Smoking (tobacco) complicating pregnancy, first trimester: Secondary | ICD-10-CM | POA: Diagnosis not present

## 2019-08-28 DIAGNOSIS — O26899 Other specified pregnancy related conditions, unspecified trimester: Secondary | ICD-10-CM

## 2019-08-28 DIAGNOSIS — Z833 Family history of diabetes mellitus: Secondary | ICD-10-CM | POA: Diagnosis not present

## 2019-08-28 DIAGNOSIS — F17210 Nicotine dependence, cigarettes, uncomplicated: Secondary | ICD-10-CM | POA: Insufficient documentation

## 2019-08-28 DIAGNOSIS — O99411 Diseases of the circulatory system complicating pregnancy, first trimester: Secondary | ICD-10-CM | POA: Diagnosis not present

## 2019-08-28 DIAGNOSIS — R109 Unspecified abdominal pain: Secondary | ICD-10-CM | POA: Diagnosis not present

## 2019-08-28 DIAGNOSIS — Z3A13 13 weeks gestation of pregnancy: Secondary | ICD-10-CM | POA: Diagnosis not present

## 2019-08-28 DIAGNOSIS — O99891 Other specified diseases and conditions complicating pregnancy: Secondary | ICD-10-CM | POA: Insufficient documentation

## 2019-08-28 DIAGNOSIS — O26891 Other specified pregnancy related conditions, first trimester: Secondary | ICD-10-CM

## 2019-08-28 LAB — URINALYSIS, ROUTINE W REFLEX MICROSCOPIC
Bilirubin Urine: NEGATIVE
Glucose, UA: NEGATIVE mg/dL
Hgb urine dipstick: NEGATIVE
Ketones, ur: NEGATIVE mg/dL
Leukocytes,Ua: NEGATIVE
Nitrite: NEGATIVE
Protein, ur: NEGATIVE mg/dL
Specific Gravity, Urine: 1.009 (ref 1.005–1.030)
pH: 8 (ref 5.0–8.0)

## 2019-08-28 LAB — BASIC METABOLIC PANEL
Anion gap: 7 (ref 5–15)
BUN: 5 mg/dL — ABNORMAL LOW (ref 6–20)
CO2: 24 mmol/L (ref 22–32)
Calcium: 9.1 mg/dL (ref 8.9–10.3)
Chloride: 105 mmol/L (ref 98–111)
Creatinine, Ser: 0.65 mg/dL (ref 0.44–1.00)
GFR calc Af Amer: >60 mL/min (ref >60)
GFR calc non Af Amer: >60 mL/min (ref >60)
Glucose, Bld: 71 mg/dL (ref 70–99)
Potassium: 3.5 mmol/L (ref 3.5–5.1)
Sodium: 136 mmol/L (ref 135–145)

## 2019-08-28 LAB — CBC
HCT: 38.4 % (ref 36.0–46.0)
Hemoglobin: 13.4 g/dL (ref 12.0–15.0)
MCH: 29.8 pg (ref 26.0–34.0)
MCHC: 34.9 g/dL (ref 30.0–36.0)
MCV: 85.5 fL (ref 80.0–100.0)
Platelets: 275 10*3/uL (ref 150–400)
RBC: 4.49 MIL/uL (ref 3.87–5.11)
RDW: 12.8 % (ref 11.5–15.5)
WBC: 5.5 10*3/uL (ref 4.0–10.5)
nRBC: 0 % (ref 0.0–0.2)

## 2019-08-28 LAB — POCT PREGNANCY, URINE: Preg Test, Ur: POSITIVE — AB

## 2019-08-28 LAB — WET PREP, GENITAL
Sperm: NONE SEEN
Trich, Wet Prep: NONE SEEN
Yeast Wet Prep HPF POC: NONE SEEN

## 2019-08-28 MED ORDER — ACETAMINOPHEN 325 MG PO TABS
650.0000 mg | ORAL_TABLET | Freq: Once | ORAL | Status: AC
  Administered 2019-08-28: 11:00:00 650 mg via ORAL
  Filled 2019-08-28: qty 2

## 2019-08-28 NOTE — MAU Provider Note (Signed)
History     CSN: [id number]  Arrival date and time: 08/28/19 5956   First Provider Initiated Contact with Patient 08/28/19 1021      Chief Complaint  Patient presents with  . Abdominal Cramping   HPI Kaylee Kim is a 28 y.o. [id number] at [redacted]w[redacted]d who presents to MAU with chief complaints  of abdominal cramping and episodic tachycardia.   Abdominal Cramping This is a recurrent problem, onset one month ago. Patient states her pain is suprapubic, waxes and wanes between 3/10 and 8/10, then resolves without intervention. She denies aggravating or alleviating factors. She occasionally manages her pain with PO Tylenol but typically copes with distraction methods.   Tachycardia Patient endorses two month history of episodes of elevated heart rate. She denies any trends related to rate of exertion, time of day. She denies shortness of breath, palpitations, syncope. No cardiac history  Most recent intercourse yesterday. Patient denies abdominal tenderness, abnormal vaginal discharge, dysuria, fever or recent illness.    OB History    Gravida  5   Para  2   Term  0   Preterm  2   AB  2   Living  2     SAB  0   TAB  2   Ectopic  0   Multiple  0   Live Births  2           Past Medical History:  Diagnosis Date  . Medical history non-contributory     Past Surgical History:  Procedure Laterality Date  . DILATION AND CURETTAGE OF UTERUS      Family History  Problem Relation Age of Onset  . Diabetes Mother     Social History   Tobacco Use  . Smoking status: Heavy Tobacco Smoker    Packs/day: 3.00    Types: Cigars  . Smokeless tobacco: Never Used  Substance Use Topics  . Alcohol use: No  . Drug use: Yes    Types: Marijuana    Allergies: No Known Allergies  No medications prior to admission.    Review of Systems  Constitutional: Negative for chills, fatigue and fever.  Respiratory: Negative for shortness of breath.   Cardiovascular: Negative for chest  pain, palpitations and leg swelling.  Gastrointestinal: Positive for abdominal pain.  Genitourinary: Negative for difficulty urinating, dysuria, flank pain, vaginal bleeding, vaginal discharge and vaginal pain.  All other systems reviewed and are negative.  Physical Exam   Blood pressure 117/70, pulse 86, temperature 98.4 F (36.9 C), temperature source Oral, resp. rate 18, height 5\' 3"  (1.6 m), weight 69.9 kg, last menstrual period 05/28/2019, SpO2 100 %.  Physical Exam  Nursing note and vitals reviewed. Constitutional: She is oriented to person, place, and time. She appears well-developed and well-nourished.  Cardiovascular: Normal rate.  Respiratory: Effort normal and breath sounds normal.  GI: Soft. She exhibits no distension. There is no abdominal tenderness. There is no rebound and no guarding.  Genitourinary:    No vaginal discharge.   Neurological: She is alert and oriented to person, place, and time.  Skin: Skin is warm and dry.  Psychiatric: She has a normal mood and affect. Her behavior is normal. Judgment and thought content normal.    MAU Course/MDM  Procedures  --Swabs collected via blind swab. Speculum exam declined by patient --Clue cells on wet prep with no other symptoms on PE or reported by patient. Discussed indications for treatment, no advised at this time. Patient agreeable to deferring treatment --VSS  throughout evaluation in MAU. Encouraged patient to consider symptom journal to track specifics   Patient Vitals for the past 24 hrs:  BP Temp Temp src Pulse Resp SpO2 Height Weight  08/28/19 1136 117/70 -- -- 86 18 100 % -- --  08/28/19 1001 124/78 98.4 F (36.9 C) Oral 84 17 100 % 5\' 3"  (1.6 m) 69.9 kg   Results for orders placed or performed during the hospital encounter of 08/28/19 (from the past 24 hour(s))  Urinalysis, Routine w reflex microscopic     Status: Abnormal   Collection Time: 08/28/19  9:54 AM  Result Value Ref Range   Color, Urine YELLOW  YELLOW   APPearance CLOUDY (A) CLEAR   Specific Gravity, Urine 1.009 1.005 - 1.030   pH 8.0 5.0 - 8.0   Glucose, UA NEGATIVE NEGATIVE mg/dL   Hgb urine dipstick NEGATIVE NEGATIVE   Bilirubin Urine NEGATIVE NEGATIVE   Ketones, ur NEGATIVE NEGATIVE mg/dL   Protein, ur NEGATIVE NEGATIVE mg/dL   Nitrite NEGATIVE NEGATIVE   Leukocytes,Ua NEGATIVE NEGATIVE  Pregnancy, urine POC     Status: Abnormal   Collection Time: 08/28/19  9:56 AM  Result Value Ref Range   Preg Test, Ur POSITIVE (A) NEGATIVE  CBC     Status: None   Collection Time: 08/28/19 10:16 AM  Result Value Ref Range   WBC 5.5 4.0 - 10.5 K/uL   RBC 4.49 3.87 - 5.11 MIL/uL   Hemoglobin 13.4 12.0 - 15.0 g/dL   HCT 16.138.4 09.636.0 - 04.546.0 %   MCV 85.5 80.0 - 100.0 fL   MCH 29.8 26.0 - 34.0 pg   MCHC 34.9 30.0 - 36.0 g/dL   RDW 40.912.8 81.111.5 - 91.415.5 %   Platelets 275 150 - 400 K/uL   nRBC 0.0 0.0 - 0.2 %  Basic metabolic panel     Status: Abnormal   Collection Time: 08/28/19 10:16 AM  Result Value Ref Range   Sodium 136 135 - 145 mmol/L   Potassium 3.5 3.5 - 5.1 mmol/L   Chloride 105 98 - 111 mmol/L   CO2 24 22 - 32 mmol/L   Glucose, Bld 71 70 - 99 mg/dL   BUN 5 (L) 6 - 20 mg/dL   Creatinine, Ser 7.820.65 0.44 - 1.00 mg/dL   Calcium 9.1 8.9 - 95.610.3 mg/dL   GFR calc non Af Amer >60 >60 mL/min   GFR calc Af Amer >60 >60 mL/min   Anion gap 7 5 - 15  Wet prep, genital     Status: Abnormal   Collection Time: 08/28/19 10:27 AM  Result Value Ref Range   Yeast Wet Prep HPF POC NONE SEEN NONE SEEN   Trich, Wet Prep NONE SEEN NONE SEEN   Clue Cells Wet Prep HPF POC PRESENT (A) NONE SEEN   WBC, Wet Prep HPF POC MODERATE (A) NONE SEEN   Sperm NONE SEEN    US OB LESS THAN 14 WEEKS WITH OB TRANSVAGINAL  Result Date: 08/28/2019 CLINICAL DATA:  Cramping EXAM: OBSTETRIC <14 WK US AND TRANSVAGINAL OB US TECHNIQUE: Both transabdominal and transvaginal ultrasound examinations were performed for complete evaluation of the gestation as well as the  maternal uterus, adnexal regions, and pelvic cul-de-sac. Transvaginal technique was performed to assess early pregnancy. COMPARISON:  None. FINDINGS: Intrauterine gestational sac: Single Yolk sac:  Not visualized Embryo:  Visualized Cardiac Activity: Visualized Heart Rate: 137 bpm MSD:   mm    w     d CRL:  68.3  mm   13 w   1 d                  [place] EDC: 03/03/2020 Subchorionic hemorrhage:  None visualized. Maternal uterus/adnexae: No adnexal mass or free fluid. IMPRESSION: Thirteen week 1 day intrauterine pregnancy. Fetal heart rate 137 beats per minute. No acute maternal findings. Electronically Signed   By: Rolm Baptise M.D.   On: 08/28/2019 10:53   Assessment and Plan  --27 y.o. [id number] at [redacted]w[redacted]d  --Pain resolved with PO Tylenol --No concerning findings --Discharge home in stable condition  Darlina Rumpf, CNM 08/28/2019, 1:11 PM

## 2019-08-28 NOTE — Discharge Instructions (Signed)

## 2019-08-28 NOTE — MAU Note (Signed)
.   Kaylee Kim is a 28 y.o. at [redacted]w[redacted]d here in MAU reporting: Lower abdominal cramping that started a month ago. She states that the pain is worse today. No VB. Patient last had intercourse yesterday, 12/21. She also reports episodes of tachycardia for the past two months but reports she is not experiencing it at the moment.  LMP: 05/28/19 Pain score: 7 Vitals:   08/28/19 1001  BP: 124/78  Pulse: 84  Resp: 17  Temp: 98.4 F (36.9 C)  SpO2: 100%     FHT:144

## 2019-08-29 LAB — GC/CHLAMYDIA PROBE AMP (~~LOC~~) NOT AT ARMC
Chlamydia: NEGATIVE
Comment: NEGATIVE
Comment: NORMAL
Neisseria Gonorrhea: NEGATIVE

## 2019-09-07 NOTE — L&D Delivery Note (Signed)
OB/GYN Faculty Practice Delivery Note  Kaylee Kim is a 29 y.o. [id number] s/p NSVD at [redacted]w[redacted]d. She was admitted for preterm labor.   ROM: 0h 15m with clear fluid GBS Status: unknown Maximum Maternal Temperature: 98.8*F  Labor Progress: Patient with cerclage and history of preterm labor was admitted to [facility] for BMZ and magnesium when she was found to be in preterm labor. Her cerclage was removed due to heavy vaginal bleeding and contractions. She continued to labor despite magnesium and was transferred to L&D. She progressed to 8 cm, and due to imminent delivery, was AROMed and pitocin was started. She progressed to complete and delivered shortly thereafter.  Delivery Date/Time: 12/31/19, 7741 Delivery: Called to room and patient was complete and pushing. Head delivered LOA. No nuchal cord present. Shoulder and body delivered in usual fashion. Infant with spontaneous cry, placed on mother's abdomen, dried and stimulated. Cord clamped x 2 after 1-minute delay, and cut by [name] under my direct supervision. Cord blood drawn. Placenta delivered spontaneously with gentle cord traction. Fundus firm with massage and Pitocin. Labia, perineum, vagina, and cervix were inspected, and found to be intact.   Placenta: intact, 3 vessel cord, 4x5 cm area of darkened blood on periphery- suspect abruption, to pathology Complications: None Lacerations: None EBL: 100 mL Analgesia: epidural  Postpartum Planning [x]  message to sent to schedule follow-up  [x]  vaccines UTD  Infant: female  APGARs 7, 8  1440g  , DO OB/GYN Fellow, Faculty Practice

## 2019-10-10 ENCOUNTER — Encounter: Payer: Self-pay | Admitting: Obstetrics & Gynecology

## 2019-10-10 ENCOUNTER — Ambulatory Visit (INDEPENDENT_AMBULATORY_CARE_PROVIDER_SITE_OTHER): Payer: Medicaid Other | Admitting: Obstetrics & Gynecology

## 2019-10-10 ENCOUNTER — Other Ambulatory Visit: Payer: Self-pay

## 2019-10-10 ENCOUNTER — Other Ambulatory Visit (HOSPITAL_COMMUNITY)
Admission: RE | Admit: 2019-10-10 | Discharge: 2019-10-10 | Disposition: A | Discharge status: Home / Self Care | Payer: Medicaid Other | Source: Ambulatory Visit | Attending: Obstetrics & Gynecology | Admitting: Obstetrics & Gynecology

## 2019-10-10 VITALS — BP 134/59 | HR 94 | Wt 164.5 lb

## 2019-10-10 DIAGNOSIS — O0990 Supervision of high risk pregnancy, unspecified, unspecified trimester: Secondary | ICD-10-CM

## 2019-10-10 DIAGNOSIS — O09212 Supervision of pregnancy with history of pre-term labor, second trimester: Secondary | ICD-10-CM | POA: Diagnosis not present

## 2019-10-10 DIAGNOSIS — Z3A19 19 weeks gestation of pregnancy: Secondary | ICD-10-CM

## 2019-10-10 DIAGNOSIS — O0992 Supervision of high risk pregnancy, unspecified, second trimester: Secondary | ICD-10-CM | POA: Diagnosis not present

## 2019-10-10 DIAGNOSIS — O09892 Supervision of other high risk pregnancies, second trimester: Secondary | ICD-10-CM | POA: Diagnosis not present

## 2019-10-10 DIAGNOSIS — O09219 Supervision of pregnancy with history of pre-term labor, unspecified trimester: Secondary | ICD-10-CM

## 2019-10-10 DIAGNOSIS — Z315 Encounter for genetic counseling: Secondary | ICD-10-CM | POA: Diagnosis not present

## 2019-10-10 MED ORDER — ASPIRIN EC 81 MG PO TBEC: 81.0000 mg | DELAYED_RELEASE_TABLET | Freq: Every day | ORAL | 2 refills | Status: DC

## 2019-10-10 MED ORDER — BLOOD PRESSURE KIT DEVI: 1.0000 | Freq: Once | 0 refills | Status: AC

## 2019-10-10 NOTE — Patient Instructions (Signed)
Second Trimester of Pregnancy The second trimester is from week 14 through week 27 (months 4 through 6). The second trimester is often a time when you feel your best. Your body has adjusted to being pregnant, and you begin to feel better physically. Usually, morning sickness has lessened or quit completely, you may have more energy, and you may have an increase in appetite. The second trimester is also a time when the fetus is growing rapidly. At the end of the sixth month, the fetus is about 9 inches long and weighs about 1 pounds. You will likely begin to feel the baby move (quickening) between 16 and 20 weeks of pregnancy. Body changes during your second trimester Your body continues to go through many changes during your second trimester. The changes vary from woman to woman.  Your weight will continue to increase. You will notice your lower abdomen bulging out.  You may begin to get stretch marks on your hips, abdomen, and breasts.  You may develop headaches that can be relieved by medicines. The medicines should be approved by your health care provider.  You may urinate more often because the fetus is pressing on your bladder.  You may develop or continue to have heartburn as a result of your pregnancy.  You may develop constipation because certain hormones are causing the muscles that push waste through your intestines to slow down.  You may develop hemorrhoids or swollen, bulging veins (varicose veins).  You may have back pain. This is caused by: ? Weight gain. ? Pregnancy hormones that are relaxing the joints in your pelvis. ? A shift in weight and the muscles that support your balance.  Your breasts will continue to grow and they will continue to become tender.  Your gums may bleed and may be sensitive to brushing and flossing.  Dark spots or blotches (chloasma, mask of pregnancy) may develop on your face. This will likely fade after the baby is born.  A dark line from your  belly button to the pubic area (linea nigra) may appear. This will likely fade after the baby is born.  You may have changes in your hair. These can include thickening of your hair, rapid growth, and changes in texture. Some women also have hair loss during or after pregnancy, or hair that feels dry or thin. Your hair will most likely return to normal after your baby is born. What to expect at prenatal visits During a routine prenatal visit:  You will be weighed to make sure you and the fetus are growing normally.  Your blood pressure will be taken.  Your abdomen will be measured to track your baby's growth.  The fetal heartbeat will be listened to.  Any test results from the previous visit will be discussed. Your health care provider may ask you:  How you are feeling.  If you are feeling the baby move.  If you have had any abnormal symptoms, such as leaking fluid, bleeding, severe headaches, or abdominal cramping.  If you are using any tobacco products, including cigarettes, chewing tobacco, and electronic cigarettes.  If you have any questions. Other tests that may be performed during your second trimester include:  Blood tests that check for: ? Low iron levels (anemia). ? High blood sugar that affects pregnant women (gestational diabetes) between 24 and 28 weeks. ? Rh antibodies. This is to check for a protein on red blood cells (Rh factor).  Urine tests to check for infections, diabetes, or protein in the   urine.  An ultrasound to confirm the proper growth and development of the baby.  An amniocentesis to check for possible genetic problems.  Fetal screens for spina bifida and Down syndrome.  HIV (human immunodeficiency virus) testing. Routine prenatal testing includes screening for HIV, unless you choose not to have this test. Follow these instructions at home: Medicines  Follow your health care provider's instructions regarding medicine use. Specific medicines may be  either safe or unsafe to take during pregnancy.  Take a prenatal vitamin that contains at least 600 micrograms (mcg) of folic acid.  If you develop constipation, try taking a stool softener if your health care provider approves. Eating and drinking   Eat a balanced diet that includes fresh fruits and vegetables, whole grains, good sources of protein such as meat, eggs, or tofu, and low-fat dairy. Your health care provider will help you determine the amount of weight gain that is right for you.  Avoid raw meat and uncooked cheese. These carry germs that can cause birth defects in the baby.  If you have low calcium intake from food, talk to your health care provider about whether you should take a daily calcium supplement.  Limit foods that are high in fat and processed sugars, such as fried and sweet foods.  To prevent constipation: ? Drink enough fluid to keep your urine clear or pale yellow. ? Eat foods that are high in fiber, such as fresh fruits and vegetables, whole grains, and beans. Activity  Exercise only as directed by your health care provider. Most women can continue their usual exercise routine during pregnancy. Try to exercise for 30 minutes at least 5 days a week. Stop exercising if you experience uterine contractions.  Avoid heavy lifting, wear low heel shoes, and practice good posture.  A sexual relationship may be continued unless your health care provider directs you otherwise. Relieving pain and discomfort  Wear a good support bra to prevent discomfort from breast tenderness.  Take warm sitz baths to soothe any pain or discomfort caused by hemorrhoids. Use hemorrhoid cream if your health care provider approves.  Rest with your legs elevated if you have leg cramps or low back pain.  If you develop varicose veins, wear support hose. Elevate your feet for 15 minutes, 3-4 times a day. Limit salt in your diet. Prenatal Care  Write down your questions. Take them to  your prenatal visits.  Keep all your prenatal visits as told by your health care provider. This is important. Safety  Wear your seat belt at all times when driving.  Make a list of emergency phone numbers, including numbers for family, friends, the hospital, and police and fire departments. General instructions  Ask your health care provider for a referral to a local prenatal education class. Begin classes no later than the beginning of month 6 of your pregnancy.  Ask for help if you have counseling or nutritional needs during pregnancy. Your health care provider can offer advice or refer you to specialists for help with various needs.  Do not use hot tubs, steam rooms, or saunas.  Do not douche or use tampons or scented sanitary pads.  Do not cross your legs for long periods of time.  Avoid cat litter boxes and soil used by cats. These carry germs that can cause birth defects in the baby and possibly loss of the fetus by miscarriage or stillbirth.  Avoid all smoking, herbs, alcohol, and unprescribed drugs. Chemicals in these products can affect the formation   and growth of the baby.  Do not use any products that contain nicotine or tobacco, such as cigarettes and e-cigarettes. If you need help quitting, ask your health care provider.  Visit your dentist if you have not gone yet during your pregnancy. Use a soft toothbrush to brush your teeth and be gentle when you floss. Contact a health care provider if:  You have dizziness.  You have mild pelvic cramps, pelvic pressure, or nagging pain in the abdominal area.  You have persistent nausea, vomiting, or diarrhea.  You have a bad smelling vaginal discharge.  You have pain when you urinate. Get help right away if:  You have a fever.  You are leaking fluid from your vagina.  You have spotting or bleeding from your vagina.  You have severe abdominal cramping or pain.  You have rapid weight gain or weight loss.  You have  shortness of breath with chest pain.  You notice sudden or extreme swelling of your face, hands, ankles, feet, or legs.  You have not felt your baby move in over an hour.  You have severe headaches that do not go away when you take medicine.  You have vision changes. Summary  The second trimester is from week 14 through week 27 (months 4 through 6). It is also a time when the fetus is growing rapidly.  Your body goes through many changes during pregnancy. The changes vary from woman to woman.  Avoid all smoking, herbs, alcohol, and unprescribed drugs. These chemicals affect the formation and growth your baby.  Do not use any tobacco products, such as cigarettes, chewing tobacco, and e-cigarettes. If you need help quitting, ask your health care provider.  Contact your health care provider if you have any questions. Keep all prenatal visits as told by your health care provider. This is important. This information is not intended to replace advice given to you by your health care provider. Make sure you discuss any questions you have with your health care provider. Document Revised: 12/15/2018 Document Reviewed: 09/28/2016 Elsevier Patient Education  [place].    Preterm Labor and Birth Information  The normal length of a pregnancy is 39-41 weeks. Preterm labor is when labor starts before 37 completed weeks of pregnancy. What are the risk factors for preterm labor? Preterm labor is more likely to occur in women who:  Have certain infections during pregnancy such as a bladder infection, sexually transmitted infection, or infection inside the uterus (chorioamnionitis).  Have a shorter-than-normal cervix.  Have gone into preterm labor before.  Have had surgery on their cervix.  Are younger than age 25 or older than age 71.  Are African American.  Are pregnant with twins or multiple babies (multiple gestation).  Take street drugs or smoke while pregnant.  Do not  gain enough weight while pregnant.  Became pregnant shortly after having been pregnant. What are the symptoms of preterm labor? Symptoms of preterm labor include:  Cramps similar to those that can happen during a menstrual period. The cramps may happen with diarrhea.  Pain in the abdomen or lower back.  Regular uterine contractions that may feel like tightening of the abdomen.  A feeling of increased pressure in the pelvis.  Increased watery or bloody mucus discharge from the vagina.  Water breaking (ruptured amniotic sac). Why is it important to recognize signs of preterm labor? It is important to recognize signs of preterm labor because babies who are born prematurely may not be fully developed.  This can put them at an increased risk for:  Long-term (chronic) heart and lung problems.  Difficulty immediately after birth with regulating body systems, including blood sugar, body temperature, heart rate, and breathing rate.  Bleeding in the brain.  Cerebral palsy.  Learning difficulties.  Death. These risks are highest for babies who are born before 34 weeks of pregnancy. How is preterm labor treated? Treatment depends on the length of your pregnancy, your condition, and the health of your baby. It may involve:  Having a stitch (suture) placed in your cervix to prevent your cervix from opening too early (cerclage).  Taking or being given medicines, such as: ? Hormone medicines. These may be given early in pregnancy to help support the pregnancy. ? Medicine to stop contractions. ? Medicines to help mature the baby's lungs. These may be prescribed if the risk of delivery is high. ? Medicines to prevent your baby from developing cerebral palsy. If the labor happens before 34 weeks of pregnancy, you may need to stay in the hospital. What should I do if I think I am in preterm labor? If you think that you are going into preterm labor, call your health care provider right  away. How can I prevent preterm labor in future pregnancies? To increase your chance of having a full-term pregnancy:  Do not use any tobacco products, such as cigarettes, chewing tobacco, and e-cigarettes. If you need help quitting, ask your health care provider.  Do not use street drugs or medicines that have not been prescribed to you during your pregnancy.  Talk with your health care provider before taking any herbal supplements, even if you have been taking them regularly.  Make sure you gain a healthy amount of weight during your pregnancy.  Watch for infection. If you think that you might have an infection, get it checked right away.  Make sure to tell your health care provider if you have gone into preterm labor before. This information is not intended to replace advice given to you by your health care provider. Make sure you discuss any questions you have with your health care provider. Document Revised: 12/15/2018 Document Reviewed: 01/14/2016 Elsevier Patient Education  2020 [employer].

## 2019-10-10 NOTE — Addendum Note (Signed)
Addended by: Marjo Bicker on: 10/10/2019 05:12 PM   Modules accepted: Orders

## 2019-10-10 NOTE — Progress Notes (Signed)
History:   Kaylee Kim is a 29 y.o. [phone] at [redacted]w[redacted]d by early ultrasound being seen today for her first obstetrical visit.  Her obstetrical history is significant for two preterm deliveries at 56 and 34 weeks; used 17P during last pregnancy. Patient does intend to breast feed. Pregnancy history fully reviewed.  Patient reports no complaints today.      HISTORY: OB History  Gravida Para Term Preterm AB Living  7 2 0 2 4 2   SAB TAB Ectopic Multiple Live Births  1 3 0 0 2    # Outcome Date GA Lbr Len/2nd Weight Sex Delivery Anes PTL Lv  7 Current           6 Preterm 02/23/14 [redacted]w[redacted]d 17:10 / 00:20 4 lb 5.1 oz (1.96 kg) M Vag-Spont EPI  LIV     Name: Lao,BOY Nur     Apgar1: 8  Apgar5: 9  5 Preterm 06/05/11 [redacted]w[redacted]d 20:29  M Vag-Spont None  LIV     Name: Smyre,BOY Alli  4 TAB 2010          3 SAB           2 TAB           1 TAB             Last pap smear was done 2018 and was normal  Past Medical History:  Diagnosis Date  . Medical history non-contributory    Past Surgical History:  Procedure Laterality Date  . DILATION AND CURETTAGE OF UTERUS     Family History  Problem Relation Age of Onset  . Diabetes Mother    Social History   Tobacco Use  . Smoking status: Heavy Tobacco Smoker    Packs/day: 3.00    Types: Cigars  . Smokeless tobacco: Never Used  Substance Use Topics  . Alcohol use: No  . Drug use: Yes    Types: Marijuana   No Known Allergies Current Outpatient Medications on File Prior to Visit  Medication Sig Dispense Refill  . acetaminophen (TYLENOL) 500 MG tablet Take 1,500 mg by mouth every 6 (six) hours as needed for mild pain.    . cetirizine (ZYRTEC) 10 MG tablet Take 10 mg by mouth daily.    . Prenatal Vit-Fe Fumarate-FA (PRENATAL MULTIVITAMIN) TABS tablet Take 1 tablet by mouth daily at 12 noon. 30 tablet 12  . BIOTIN PO Take 2 capsules by mouth daily.     No current facility-administered medications on file prior to visit.    Review of  Systems Pertinent items noted in HPI and remainder of comprehensive ROS otherwise negative. Physical Exam:   Vitals:   10/10/19 0946  BP: (!) 134/59  Pulse: 94  Weight: 164 lb 8 oz (74.6 kg)   Fetal Heart Rate (bpm): 140 Uterus:  Fundal Height: 19 cm  Pelvic Exam: Perineum: no hemorrhoids, normal perineum   Vulva: normal external genitalia, no lesions   Vagina:  normal mucosa, normal discharge   Cervix: no lesions and normal, pap smear done.    Adnexa: normal adnexa and no mass, fullness, tenderness   Bony Pelvis: average  System: General: well-developed, well-nourished female in no acute distress   Breasts:  normal appearance, no masses or tenderness bilaterally   Skin: normal coloration and turgor, no rashes   Neurologic: oriented, normal, negative, normal mood   Extremities: normal strength, tone, and muscle mass, ROM of all joints is normal   HEENT PERRLA, extraocular movement intact and sclera  clear, anicteric   Mouth/Teeth mucous membranes moist, pharynx normal without lesions and dental hygiene good   Neck supple and no masses   Cardiovascular: regular rate and rhythm   Respiratory:  no respiratory distress, normal breath sounds   Abdomen: soft, non-tender; bowel sounds normal; no masses,  no organomegaly    Assessment:    Pregnancy: [id number] Patient Active Problem List   Diagnosis Date Noted  . Supervision of high risk pregnancy, antepartum 10/10/2019  . Previous preterm deliveries at 14 and 34 weeks, antepartum 02/25/2014     Plan:    1. Previous preterm deliveries at 58 and 34 weeks, antepartum Counseled about Makena, patient desires this. This will be ordered to be started ASAP. ASA also recommended.  Cervical length will be followed, transvaginal scan ordered to be done concurrently with anatomy scan. - [place] MFM OB TRANSVAGINAL; Future - aspirin EC 81 MG tablet; Take 1 tablet (81 mg total) by mouth daily. Take after 12 weeks for prevention of preeclampsia/preterm  labor later in pregnancy  Dispense: 300 tablet; Refill: 2  2. Supervision of high risk pregnancy, antepartum - Cytology - PAP( [facility]) - Genetic Screening - Obstetric Panel, Including HIV - [facility] AMB BABYSCRIPTS SCHEDULE OPTIMIZATION - Culture, OB Urine - Comprehensive metabolic panel - Hemoglobin A1c - TSH - Protein / creatinine ratio, urine - [place] MFM OB TRANSVAGINAL; Future - [place] MFM OB DETAIL +14 WK; Future - aspirin EC 81 MG tablet; Take 1 tablet (81 mg total) by mouth daily. Take after 12 weeks for prevention of preeclampsia/preterm labor later in pregnancy  Dispense: 300 tablet; Refill: 2 - AFP, Serum, Open Spina Bifida  Initial labs drawn. Continue prenatal vitamins. Genetic Screening discussed, NIPS and AFP screen: ordered. Ultrasound discussed; fetal anatomic survey: ordered. Problem list reviewed and updated. The nature of [facility] with multiple MDs and other Advanced Practice Providers was explained to patient; also emphasized that residents, students are part of our team. Routine obstetric precautions reviewed. Return in about 4 weeks (around 11/07/2019) for Virtual OB Visit (will make separate appointments for 17P once obtained).     Verita Schneiders, MD, [place] for [facility], [place]

## 2019-10-10 NOTE — Progress Notes (Signed)
Kaylee Kim Auto-Injector Rx called into [facility], they will ship directly to patient. Instructed patient how to use auto-injectors and patient provided return demonstration.

## 2019-10-11 ENCOUNTER — Encounter: Payer: Self-pay | Admitting: *Deleted

## 2019-10-11 DIAGNOSIS — O0990 Supervision of high risk pregnancy, unspecified, unspecified trimester: Secondary | ICD-10-CM | POA: Diagnosis not present

## 2019-10-11 LAB — OBSTETRIC PANEL, INCLUDING HIV
Antibody Screen: NEGATIVE
Basophils Absolute: 0 10*3/uL (ref 0.0–0.2)
Basos: 0 %
EOS (ABSOLUTE): 0.1 10*3/uL (ref 0.0–0.4)
Eos: 1 %
HIV Screen 4th Generation wRfx: NONREACTIVE
Hematocrit: 36.4 % (ref 34.0–46.6)
Hemoglobin: 12.3 g/dL (ref 11.1–15.9)
Hepatitis B Surface Ag: NEGATIVE
Immature Grans (Abs): 0.2 10*3/uL — ABNORMAL HIGH (ref 0.0–0.1)
Immature Granulocytes: 2 %
Lymphocytes Absolute: 1.8 10*3/uL (ref 0.7–3.1)
Lymphs: 24 %
MCH: 30 pg (ref 26.6–33.0)
MCHC: 33.8 g/dL (ref 31.5–35.7)
MCV: 89 fL (ref 79–97)
Monocytes Absolute: 0.7 10*3/uL (ref 0.1–0.9)
Monocytes: 9 %
Neutrophils Absolute: 5.1 10*3/uL (ref 1.4–7.0)
Neutrophils: 64 %
Platelets: 239 10*3/uL (ref 150–450)
RBC: 4.1 x10E6/uL (ref 3.77–5.28)
RDW: 13.8 % (ref 11.7–15.4)
RPR Ser Ql: NONREACTIVE
Rh Factor: POSITIVE
Rubella Antibodies, IGG: 7.66 index (ref >0.99)
WBC: 7.8 10*3/uL (ref 3.4–10.8)

## 2019-10-11 LAB — COMPREHENSIVE METABOLIC PANEL
ALT: 9 IU/L (ref 0–32)
AST: 17 IU/L (ref 0–40)
Albumin/Globulin Ratio: 1.2 (ref 1.2–2.2)
Albumin: 3.6 g/dL — ABNORMAL LOW (ref 3.9–5.0)
Alkaline Phosphatase: 101 IU/L (ref 39–117)
BUN/Creatinine Ratio: 11 (ref 9–23)
BUN: 6 mg/dL (ref 6–20)
Bilirubin Total: <0.2 mg/dL (ref 0.0–1.2)
CO2: 20 mmol/L (ref 20–29)
Calcium: 9.4 mg/dL (ref 8.7–10.2)
Chloride: 102 mmol/L (ref 96–106)
Creatinine, Ser: 0.54 mg/dL — ABNORMAL LOW (ref 0.57–1.00)
GFR calc Af Amer: 151 mL/min/{1.73_m2} (ref >59)
GFR calc non Af Amer: 131 mL/min/{1.73_m2} (ref >59)
Globulin, Total: 3.1 g/dL (ref 1.5–4.5)
Glucose: 82 mg/dL (ref 65–99)
Potassium: 4.3 mmol/L (ref 3.5–5.2)
Sodium: 135 mmol/L (ref 134–144)
Total Protein: 6.7 g/dL (ref 6.0–8.5)

## 2019-10-11 LAB — PROTEIN / CREATININE RATIO, URINE
Creatinine, Urine: 124.9 mg/dL
Protein, Ur: 8.7 mg/dL
Protein/Creat Ratio: 70 mg/g creat (ref 0–200)

## 2019-10-11 LAB — CYTOLOGY - PAP: Diagnosis: NEGATIVE

## 2019-10-11 LAB — HEMOGLOBIN A1C
Est. average glucose Bld gHb Est-mCnc: 100 mg/dL
Hgb A1c MFr Bld: 5.1 % (ref 4.8–5.6)

## 2019-10-11 LAB — TSH: TSH: 0.819 u[IU]/mL (ref 0.450–4.500)

## 2019-10-12 LAB — AFP, SERUM, OPEN SPINA BIFIDA
AFP MoM: 0.86
AFP Value: 46.2 ng/mL
Gest. Age on Collection Date: 19.3 weeks
Maternal Age At EDD: 26.5 yr
OSBR Risk 1 IN: 10000
Test Results:: NEGATIVE
Weight: 164 [lb_av]

## 2019-10-14 LAB — URINE CULTURE, OB REFLEX

## 2019-10-14 LAB — CULTURE, OB URINE

## 2019-10-16 ENCOUNTER — Encounter: Payer: Self-pay | Admitting: General Practice

## 2019-10-24 ENCOUNTER — Other Ambulatory Visit: Payer: Self-pay

## 2019-10-24 ENCOUNTER — Encounter (HOSPITAL_COMMUNITY): Payer: Self-pay

## 2019-10-24 ENCOUNTER — Ambulatory Visit (HOSPITAL_COMMUNITY)
Admission: RE | Admit: 2019-10-24 | Discharge: 2019-10-24 | Disposition: A | Discharge status: Home / Self Care | Payer: Medicaid Other | Source: Ambulatory Visit | Attending: Obstetrics and Gynecology | Admitting: Obstetrics and Gynecology

## 2019-10-24 ENCOUNTER — Ambulatory Visit (HOSPITAL_COMMUNITY): Payer: Medicaid Other | Admitting: *Deleted

## 2019-10-24 ENCOUNTER — Other Ambulatory Visit (HOSPITAL_COMMUNITY): Payer: Self-pay | Admitting: *Deleted

## 2019-10-24 DIAGNOSIS — O09219 Supervision of pregnancy with history of pre-term labor, unspecified trimester: Secondary | ICD-10-CM | POA: Insufficient documentation

## 2019-10-24 DIAGNOSIS — O26879 Cervical shortening, unspecified trimester: Secondary | ICD-10-CM

## 2019-10-24 DIAGNOSIS — O0990 Supervision of high risk pregnancy, unspecified, unspecified trimester: Secondary | ICD-10-CM | POA: Diagnosis not present

## 2019-10-24 DIAGNOSIS — O0992 Supervision of high risk pregnancy, unspecified, second trimester: Secondary | ICD-10-CM

## 2019-10-24 DIAGNOSIS — Z0489 Encounter for examination and observation for other specified reasons: Secondary | ICD-10-CM

## 2019-10-24 DIAGNOSIS — O09212 Supervision of pregnancy with history of pre-term labor, second trimester: Secondary | ICD-10-CM

## 2019-10-24 DIAGNOSIS — IMO0002 Reserved for concepts with insufficient information to code with codable children: Secondary | ICD-10-CM

## 2019-10-24 DIAGNOSIS — Z3A21 21 weeks gestation of pregnancy: Secondary | ICD-10-CM | POA: Diagnosis not present

## 2019-10-26 ENCOUNTER — Encounter: Payer: Self-pay | Admitting: Obstetrics & Gynecology

## 2019-10-26 DIAGNOSIS — O26872 Cervical shortening, second trimester: Secondary | ICD-10-CM | POA: Insufficient documentation

## 2019-10-29 ENCOUNTER — Encounter: Payer: Self-pay | Admitting: *Deleted

## 2019-10-29 ENCOUNTER — Inpatient Hospital Stay (HOSPITAL_COMMUNITY)
Admission: AD | Admit: 2019-10-29 | Discharge: 2019-10-29 | Disposition: A | Discharge status: Home / Self Care | Payer: Medicaid Other | Attending: Obstetrics and Gynecology | Admitting: Obstetrics and Gynecology

## 2019-10-29 ENCOUNTER — Encounter (HOSPITAL_COMMUNITY): Payer: Self-pay | Admitting: Obstetrics and Gynecology

## 2019-10-29 ENCOUNTER — Other Ambulatory Visit: Payer: Self-pay

## 2019-10-29 DIAGNOSIS — F17210 Nicotine dependence, cigarettes, uncomplicated: Secondary | ICD-10-CM | POA: Insufficient documentation

## 2019-10-29 DIAGNOSIS — O26892 Other specified pregnancy related conditions, second trimester: Secondary | ICD-10-CM | POA: Insufficient documentation

## 2019-10-29 DIAGNOSIS — O26872 Cervical shortening, second trimester: Secondary | ICD-10-CM | POA: Insufficient documentation

## 2019-10-29 DIAGNOSIS — R1030 Lower abdominal pain, unspecified: Secondary | ICD-10-CM | POA: Diagnosis present

## 2019-10-29 DIAGNOSIS — O99332 Smoking (tobacco) complicating pregnancy, second trimester: Secondary | ICD-10-CM | POA: Diagnosis not present

## 2019-10-29 DIAGNOSIS — Z833 Family history of diabetes mellitus: Secondary | ICD-10-CM | POA: Insufficient documentation

## 2019-10-29 DIAGNOSIS — O0992 Supervision of high risk pregnancy, unspecified, second trimester: Secondary | ICD-10-CM

## 2019-10-29 DIAGNOSIS — Z3A22 22 weeks gestation of pregnancy: Secondary | ICD-10-CM | POA: Diagnosis not present

## 2019-10-29 DIAGNOSIS — R102 Pelvic and perineal pain: Secondary | ICD-10-CM | POA: Insufficient documentation

## 2019-10-29 DIAGNOSIS — N949 Unspecified condition associated with female genital organs and menstrual cycle: Secondary | ICD-10-CM | POA: Diagnosis present

## 2019-10-29 DIAGNOSIS — O0990 Supervision of high risk pregnancy, unspecified, unspecified trimester: Secondary | ICD-10-CM

## 2019-10-29 LAB — WET PREP, GENITAL
Clue Cells Wet Prep HPF POC: NONE SEEN
Sperm: NONE SEEN
Trich, Wet Prep: NONE SEEN
Yeast Wet Prep HPF POC: NONE SEEN

## 2019-10-29 MED ORDER — ACETAMINOPHEN 500 MG PO TABS
1000.0000 mg | ORAL_TABLET | Freq: Once | ORAL | Status: AC
  Administered 2019-10-29: 1000 mg via ORAL
  Filled 2019-10-29: qty 2

## 2019-10-29 NOTE — MAU Provider Note (Addendum)
Chief Complaint:  Abdominal Pain   First Provider Initiated Contact with Patient 10/29/19 1625      HPI: Kaylee Kim is a 29 y.o. [id number] at [redacted]w[redacted]d by early ultrasound who presents to maternity admissions reporting lower right abdominal pain. The pain began this morning and the patient states it has progressively gotten worse. She also states it makes her feel like she needs to urinate, but then when she urinates it feels like her "bladder can't empty". The pain worsens with movement and she has achieved only mild pain relief with 2 pills of aleve earlier today. She reports good fetal movement, denies LOF, vaginal bleeding, vaginal itching/burning, urinary symptoms, h/a, dizziness, n/v, or fever/chills.    Of note she has a history of preterm deliveries and a shortened cervix (2.4 cm), and she began taking 17P about three weeks ago. She has a cerclage placement scheduled for next week.   HPI  Past Medical History: Past Medical History:  Diagnosis Date   Medical history non-contributory     Past obstetric history: OB History  Gravida Para Term Preterm AB Living  7 2 0 2 4 2   SAB TAB Ectopic Multiple Live Births  1 3 0 0 2    # Outcome Date GA Lbr Len/2nd Weight Sex Delivery Anes PTL Lv  7 Current           6 Preterm 02/23/14 [redacted]w[redacted]d 17:10 / 00:20 1960 g M Vag-Spont EPI  LIV  5 Preterm 06/05/11 [redacted]w[redacted]d 20:29  M Vag-Spont None  LIV  4 TAB 2010          3 SAB           2 TAB           1 TAB             Past Surgical History: Past Surgical History:  Procedure Laterality Date   DILATION AND CURETTAGE OF UTERUS      Family History: Family History  Problem Relation Age of Onset   Diabetes Mother     Social History: Social History   Tobacco Use   Smoking status: Heavy Tobacco Smoker    Packs/day: 3.00    Types: Cigars   Smokeless tobacco: Never Used  Substance Use Topics   Alcohol use: No   Drug use: Yes    Types: Marijuana    Comment: used a month ago    Allergies: No  Known Allergies  Meds:  Medications Prior to Admission  Medication Sig Dispense Refill Last Dose   BIOTIN PO Take 2 capsules by mouth daily.   Past Month at Unknown time   cetirizine (ZYRTEC) 10 MG tablet Take 10 mg by mouth daily.   10/29/2019 at Unknown time   Prenatal Vit-Fe Fumarate-FA (PRENATAL MULTIVITAMIN) TABS tablet Take 1 tablet by mouth daily at 12 noon. 30 tablet 12 10/29/2019 at Unknown time   acetaminophen (TYLENOL) 500 MG tablet Take 1,500 mg by mouth every 6 (six) hours as needed for mild pain.      aspirin EC 81 MG tablet Take 1 tablet (81 mg total) by mouth daily. Take after 12 weeks for prevention of preeclampsia/preterm labor later in pregnancy (Patient not taking: Reported on 10/24/2019) 300 tablet 2     ROS:  Review of Systems  All other systems reviewed and are negative.  I have reviewed patient's Past Medical Hx, Surgical Hx, Family Hx, Social Hx, medications and allergies.   Physical Exam   Patient Vitals for the  past 24 hrs:  BP Temp Temp src Pulse Resp SpO2 Height Weight  10/29/19 1545 125/66 98.4 F (36.9 C) Oral 86 16 100 % 5\' 4"  (1.626 m) 73.7 kg   Constitutional: Well-developed, well-nourished female in no acute distress.  Cardiovascular: normal rate Respiratory: normal effort GI: Abd soft, non-tender, gravid appropriate for gestational age.  MS: Extremities nontender, no edema, normal ROM Neurologic: Alert and oriented x 4.  GU: Neg CVAT.  PELVIC EXAM: Cervix pink, visually closed, without lesion, scant white creamy discharge, vaginal walls and external genitalia normal Bimanual exam: Cervix closed, 0%, medium, posterior, neg CMT, uterus nontender, nonenlarged, adnexa without tenderness, enlargement, or mass      Labs: Results for orders placed or performed during the hospital encounter of 10/29/19 (from the past 24 hour(s))  Wet prep, genital     Status: Abnormal   Collection Time: 10/29/19  4:45 PM   Specimen: Vaginal  Result Value Ref Range    Yeast Wet Prep HPF POC NONE SEEN NONE SEEN   Trich, Wet Prep NONE SEEN NONE SEEN   Clue Cells Wet Prep HPF POC NONE SEEN NONE SEEN   WBC, Wet Prep HPF POC MANY (A) NONE SEEN   Sperm NONE SEEN    B/Positive/-- (02/03 1123)  Imaging:  04-15-1999 MFM OB TRANSVAGINAL  Result Date: 10/24/2019 ----------------------------------------------------------------------  OBSTETRICS REPORT                       (Signed Final 10/24/2019 11:43 am) ---------------------------------------------------------------------- Patient Info  ID #:       10/26/2019                          D.O.B.:  11-18-1990 (28 yrs)  Name:       Kaylee Kim                      Visit Date: 10/24/2019 10:08 am ---------------------------------------------------------------------- Performed By  Performed By:     10/26/2019 Tester BS,       Secondary Phy.:   [facility]                    RDMS, RVT  Attending:        [facility] MD        Address:          [address]                                                             [address]  Referred By:      [id number]               [facility]                                  Fetal Care ---------------------------------------------------------------------- Orders   #  Description  Code         Ordered By   1  [place] MFM OB TRANSVAGINAL               [id number]      Jaynie Collins   2  [place] MFM OB DETAIL +14 WK              76811.01     Jaynie Collins  ----------------------------------------------------------------------   #  Order #                    Accession #                 Episode #   1  [id number]                  [id number]                  [id number]   2  [id number]                  [id number]                  [id number]  ---------------------------------------------------------------------- Indications   [redacted] weeks gestation of pregnancy                Z3A.21   Encounter for antenatal screening for          Z36.3   malformations   Poor obstetric  history: Previous preterm       O09.219   delivery, antepartum   Cervical shortening, second trimester          O26.872  ---------------------------------------------------------------------- Fetal Evaluation  Num Of Fetuses:         1  Fetal Heart Rate(bpm):  150  Cardiac Activity:       Observed  Presentation:           Cephalic  Placenta:               Anterior  P. Cord Insertion:      Visualized, central  Amniotic Fluid  AFI FV:      Within normal limits                              Largest Pocket(cm)                              5.23 ---------------------------------------------------------------------- Biometry  BPD:      50.1  mm     G. Age:  21w 1d         44  %    CI:        81.05   %    70 - 86                                                          FL/HC:      19.0   %    15.9 - 20.3  HC:      175.7  mm     G. Age:  20w 1d          4  %    HC/AC:      1.08  1.06 - 1.25  AC:      162.4  mm     G. Age:  21w 2d         44  %    FL/BPD:     66.7   %  FL:       33.4  mm     G. Age:  20w 3d         16  %    FL/AC:      20.6   %    20 - 24  HUM:      31.4  mm     G. Age:  20w 3d         26  %  CER:        22  mm     G. Age:  21w 0d         39  %  NFT:       4.6  mm  LV:        6.2  mm  CM:        5.2  mm  Est. FW:     382  gm    0 lb 13 oz      24  % ---------------------------------------------------------------------- OB History  Blood Type:    B+  Gravidity:    7         Term:   0        Prem:   2        SAB:   1  TOP:          3       Ectopic:  0        Living: 2 ---------------------------------------------------------------------- Gestational Age  LMP:           21w 2d        Date:  05/28/19                 EDD:   03/03/20  U/S Today:     20w 5d                                        EDD:   03/07/20  Best:          [place] 2d     Det. By:  LMP  (05/28/19)          EDD:   03/03/20 ---------------------------------------------------------------------- Anatomy  Cranium:               Appears normal          LVOT:                   Appears normal  Cavum:                 Appears normal         Aortic Arch:            Appears normal  Ventricles:            Appears normal         Ductal Arch:            Appears normal  Choroid Plexus:        Appears normal         Diaphragm:              Appears normal  Cerebellum:            Appears normal         Stomach:                Appears normal, left                                                                        sided  Posterior Fossa:       Appears normal         Abdomen:                Appears normal  Nuchal Fold:           Appears normal         Abdominal Wall:         Appears nml (cord                                                                        insert, abd wall)  Face:                  Not well visualized    Cord Vessels:           Appears normal (3                                                                        vessel cord)  Lips:                  Not well visualized    Kidneys:                Appear normal  Palate:                Not well visualized    Bladder:                Appears normal  Thoracic:              Appears normal         Spine:                  Appears normal  Heart:                 Appears normal         Upper Extremities:      Appears normal                         (4CH, axis, and                         situs)  RVOT:  Appears normal         Lower Extremities:      Appears normal  Other:  Female gender.  Technically difficult due to fetal position. ---------------------------------------------------------------------- Cervix Uterus Adnexa  Cervix  Length:            2.4  cm.  Measured transvaginally.  Uterus  No abnormality visualized.  Left Ovary  Within normal limits. No adnexal mass visualized.  Right Ovary  Within normal limits. No adnexal mass visualized.  Cul De Sac  No free fluid seen.  Adnexa  No abnormality visualized. ---------------------------------------------------------------------- Impression  Ms.  Kaylee Binning, G7 P2 at 21-weeks' gestation, is here for fetal  anatomy scan.  On cell free fetal DNA screening, the risks of  fetal aneuploidies are not increased.  Obstetric history is significant for a spontaneous preterm  delivery in 2012 at [redacted] weeks gestation of a female infant.  He is  in good health now.  In 2015, she had a spontaneous preterm  delivery at [redacted] weeks gestation of a female infant weighing  1,960 grams at birth.  He is in good health.  In the second  pregnancy, she took prophylactic progesterone injections.  Patient takes prophylactic progesterone injections in this  pregnancy.  She does not have symptoms of pelvic pressure  or vaginal bleeding.  We performed fetal anatomy scan.  Amniotic fluid is normal  and good fetal activity seen.  Fetal biometry is consistent with  her previously established dates.  No markers of aneuploidies  or fetal structural defects are seen.  Because of her history of preterm deliveries, we performed a  transvaginal ultrasound to evaluate the cervical length.  The  cervix measures between 2.4 and 2.5 cm.  No further  shortening was seen on transfundal pressure.  I counseled the patient with help of ultrasound images.  History of preterm deliveries and short cervix (2.4 cm)  increase the risk of recurrent preterm deliveries.  I discussed  the option of rescue cerclage.  I explained the procedure and  possible complications including bleeding, infection,  miscarriage or injuries to bladder or bowel (rare).  Alternatively, we can reevaluate the cervical length next week.  Patient opted not to have cerclage now.  She will be returning  next week for transvaginal ultrasound. ---------------------------------------------------------------------- Recommendations  -An appointment was made for the patient to return next week  for completion of fetal anatomy and cervical length  measurement. ----------------------------------------------------------------------                  Noralee Space,  MD Electronically Signed Final Report   10/24/2019 11:43 am ----------------------------------------------------------------------  [place] MFM OB DETAIL +14 WK  Result Date: 10/24/2019 ----------------------------------------------------------------------  OBSTETRICS REPORT                       (Signed Final 10/24/2019 11:43 am) ---------------------------------------------------------------------- Patient Info  ID #:       [id number]                          D.O.B.:  June 09, 1991 (28 yrs)  Name:       [name]                      Visit Date: 10/24/2019 10:08 am ---------------------------------------------------------------------- Performed By  Performed By:     Ramond Craver Tester BS,       Secondary Phy.:   [facility]  RDMS, RVT  Attending:        Noralee Space MD        Address:          [address]                                                             [address]  Referred By:      Jethro Bastos               Location[facility]         [facility]                                  Fetal Care ---------------------------------------------------------------------- Orders   #  Description                          Code         Ordered By   1  [place] MFM OB TRANSVAGINAL               [phone]      UGONNA ANYANWU   2  [place] MFM OB DETAIL +14 WK              76811.01     Jaynie Collins  ----------------------------------------------------------------------   #  Order #                    Accession #                 Episode #   1  [id number]                  [id number]                  [id number]   2  [id number]                  [id number]                  [id number]  ---------------------------------------------------------------------- Indications   [redacted] weeks gestation of pregnancy                Z3A.21   Encounter for antenatal screening for          Z36.3   malformations   Poor obstetric history: Previous preterm       O09.219   delivery, antepartum   Cervical shortening, second trimester           O26.872  ---------------------------------------------------------------------- Fetal Evaluation  Num Of Fetuses:         1  Fetal Heart Rate(bpm):  150  Cardiac Activity:       Observed  Presentation:           Cephalic  Placenta:               Anterior  P. Cord Insertion:      Visualized, central  Amniotic Fluid  AFI FV:      Within normal limits  Largest Pocket(cm)                              5.23 ---------------------------------------------------------------------- Biometry  BPD:      50.1  mm     G. Age:  21w 1d         44  %    CI:        81.05   %    70 - 86                                                          FL/HC:      19.0   %    15.9 - 20.3  HC:      175.7  mm     G. Age:  20w 1d          4  %    HC/AC:      1.08        1.06 - 1.25  AC:      162.4  mm     G. Age:  21w 2d         44  %    FL/BPD:     66.7   %  FL:       33.4  mm     G. Age:  20w 3d         16  %    FL/AC:      20.6   %    20 - 24  HUM:      31.4  mm     G. Age:  20w 3d         26  %  CER:        22  mm     G. Age:  21w 0d         39  %  NFT:       4.6  mm  LV:        6.2  mm  CM:        5.2  mm  Est. FW:     382  gm    0 lb 13 oz      24  % ---------------------------------------------------------------------- OB History  Blood Type:    B+  Gravidity:    7         Term:   0        Prem:   2        SAB:   1  TOP:          3       Ectopic:  0        Living: 2 ---------------------------------------------------------------------- Gestational Age  LMP:           21w 2d        Date:  05/28/19                 EDD:   03/03/20  U/S Today:     20w 5d                                        EDD:  03/07/20  Best:          [place] 2d     Det. By:  LMP  (05/28/19)          EDD:   03/03/20 ---------------------------------------------------------------------- Anatomy  Cranium:               Appears normal         LVOT:                   Appears normal  Cavum:                 Appears normal         Aortic Arch:             Appears normal  Ventricles:            Appears normal         Ductal Arch:            Appears normal  Choroid Plexus:        Appears normal         Diaphragm:              Appears normal  Cerebellum:            Appears normal         Stomach:                Appears normal, left                                                                        sided  Posterior Fossa:       Appears normal         Abdomen:                Appears normal  Nuchal Fold:           Appears normal         Abdominal Wall:         Appears nml (cord                                                                        insert, abd wall)  Face:                  Not well visualized    Cord Vessels:           Appears normal (3                                                                        vessel cord)  Lips:                  Not well visualized  Kidneys:                Appear normal  Palate:                Not well visualized    Bladder:                Appears normal  Thoracic:              Appears normal         Spine:                  Appears normal  Heart:                 Appears normal         Upper Extremities:      Appears normal                         (4CH, axis, and                         situs)  RVOT:                  Appears normal         Lower Extremities:      Appears normal  Other:  Female gender.  Technically difficult due to fetal position. ---------------------------------------------------------------------- Cervix Uterus Adnexa  Cervix  Length:            2.4  cm.  Measured transvaginally.  Uterus  No abnormality visualized.  Left Ovary  Within normal limits. No adnexal mass visualized.  Right Ovary  Within normal limits. No adnexal mass visualized.  Cul De Sac  No free fluid seen.  Adnexa  No abnormality visualized. ---------------------------------------------------------------------- Impression  [name], G7 P2 at 21-weeks' gestation, is here for fetal  anatomy scan.  On cell free fetal DNA screening, the  risks of  fetal aneuploidies are not increased.  Obstetric history is significant for a spontaneous preterm  delivery in 2012 at 3823 weeks gestation of a female infant.  He is  in good health now.  In 2015, she had a spontaneous preterm  delivery at 6934 weeks gestation of a female infant weighing  1,960 grams at birth.  He is in good health.  In the second  pregnancy, she took prophylactic progesterone injections.  Patient takes prophylactic progesterone injections in this  pregnancy.  She does not have symptoms of pelvic pressure  or vaginal bleeding.  We performed fetal anatomy scan.  Amniotic fluid is normal  and good fetal activity seen.  Fetal biometry is consistent with  her previously established dates.  No markers of aneuploidies  or fetal structural defects are seen.  Because of her history of preterm deliveries, we performed a  transvaginal ultrasound to evaluate the cervical length.  The  cervix measures between 2.4 and 2.5 cm.  No further  shortening was seen on transfundal pressure.  I counseled the patient with help of ultrasound images.  History of preterm deliveries and short cervix (2.4 cm)  increase the risk of recurrent preterm deliveries.  I discussed  the option of rescue cerclage.  I explained the procedure and  possible complications including bleeding, infection,  miscarriage or injuries to bladder or bowel (rare).  Alternatively, we can reevaluate the cervical length next week.  Patient opted not to have cerclage now.  She will be returning  next  week for transvaginal ultrasound. ---------------------------------------------------------------------- Recommendations  -An appointment was made for the patient to return next week  for completion of fetal anatomy and cervical length  measurement. ----------------------------------------------------------------------                  Noralee Space, MD Electronically Signed Final Report   10/24/2019 11:43 am  ----------------------------------------------------------------------   MAU Course/MDM: Orders Placed This Encounter  Procedures   Wet prep, genital    Meds ordered this encounter  Medications   acetaminophen (TYLENOL) tablet 1,000 mg    Patient presenting with right lower abdominal pain since this morning that exacerbates with movement. Presentation is most consistent with round ligament pain, but given history of preterm deliveries and shortened cervix, cervical exam was performed to rule out cervical dilation. Given her symptoms and because it was not tested at her initial prenatal, pap smear was performed to test for [facility]. Cervical exam was normal and wet prep was negative, supporting the leading diagnosis of round ligament pain.   Treatments in MAU included 1000 mg Tylenol. Pt discharged with labor precautions and recommended to take OTC tylenol for pain management.    Assessment: 1. Round ligament pain   2. Supervision of high risk pregnancy, antepartum   3. Short cervix, antepartum, second trimester     Plan: Discharge home with labor precautions   - Continue 17P - OTC tylenol for pain control  Faris Almubaslat, Ms3 10/29/2019 5:15 PM  I confirm that I have verified the information documented in the medical student's note and that I have also personally reperformed the history, physical exam and all medical decision making activities of this service and have verified that all service and findings are accurately documented in this student's note.   FHTs by doppler: 150 bpm   Raelyn Mora, [place] 10/29/2019 7:45 PM

## 2019-10-29 NOTE — MAU Note (Signed)
Woke up this morning with a short pain in rt lower side.  Pain has continued, increasing and decreasing, keeps going up and down.  When up is bad enough she can't walk.  Has appt on Wed for cerclage.

## 2019-10-30 ENCOUNTER — Other Ambulatory Visit (HOSPITAL_COMMUNITY): Payer: Self-pay | Admitting: Obstetrics & Gynecology

## 2019-10-31 ENCOUNTER — Other Ambulatory Visit (HOSPITAL_COMMUNITY): Payer: Self-pay | Admitting: *Deleted

## 2019-10-31 ENCOUNTER — Other Ambulatory Visit (HOSPITAL_COMMUNITY)
Admission: RE | Admit: 2019-10-31 | Discharge: 2019-10-31 | Disposition: A | Discharge status: Home / Self Care | Payer: Medicaid Other | Source: Ambulatory Visit | Attending: Obstetrics and Gynecology | Admitting: Obstetrics and Gynecology

## 2019-10-31 ENCOUNTER — Ambulatory Visit (HOSPITAL_COMMUNITY)
Admission: RE | Admit: 2019-10-31 | Discharge: 2019-10-31 | Disposition: A | Discharge status: Home / Self Care | Payer: Medicaid Other | Source: Ambulatory Visit | Attending: Obstetrics and Gynecology | Admitting: Obstetrics and Gynecology

## 2019-10-31 ENCOUNTER — Ambulatory Visit (HOSPITAL_COMMUNITY): Payer: Medicaid Other

## 2019-10-31 ENCOUNTER — Encounter (HOSPITAL_COMMUNITY): Payer: Self-pay

## 2019-10-31 ENCOUNTER — Telehealth: Payer: Self-pay | Admitting: Obstetrics and Gynecology

## 2019-10-31 ENCOUNTER — Other Ambulatory Visit: Payer: Self-pay | Admitting: Obstetrics and Gynecology

## 2019-10-31 ENCOUNTER — Telehealth (HOSPITAL_COMMUNITY): Payer: Self-pay | Admitting: *Deleted

## 2019-10-31 ENCOUNTER — Ambulatory Visit (HOSPITAL_COMMUNITY): Payer: Medicaid Other | Admitting: *Deleted

## 2019-10-31 ENCOUNTER — Other Ambulatory Visit: Payer: Self-pay

## 2019-10-31 DIAGNOSIS — Z20822 Contact with and (suspected) exposure to covid-19: Secondary | ICD-10-CM | POA: Diagnosis not present

## 2019-10-31 DIAGNOSIS — O0990 Supervision of high risk pregnancy, unspecified, unspecified trimester: Secondary | ICD-10-CM | POA: Diagnosis not present

## 2019-10-31 DIAGNOSIS — Z01812 Encounter for preprocedural laboratory examination: Secondary | ICD-10-CM | POA: Insufficient documentation

## 2019-10-31 DIAGNOSIS — O26872 Cervical shortening, second trimester: Secondary | ICD-10-CM

## 2019-10-31 DIAGNOSIS — IMO0002 Reserved for concepts with insufficient information to code with codable children: Secondary | ICD-10-CM

## 2019-10-31 DIAGNOSIS — Z0489 Encounter for examination and observation for other specified reasons: Secondary | ICD-10-CM | POA: Diagnosis not present

## 2019-10-31 DIAGNOSIS — Z362 Encounter for other antenatal screening follow-up: Secondary | ICD-10-CM

## 2019-10-31 DIAGNOSIS — O26879 Cervical shortening, unspecified trimester: Secondary | ICD-10-CM | POA: Insufficient documentation

## 2019-10-31 DIAGNOSIS — Z3A22 22 weeks gestation of pregnancy: Secondary | ICD-10-CM

## 2019-10-31 LAB — SARS CORONAVIRUS 2 (TAT 6-24 HRS): SARS Coronavirus 2: NEGATIVE

## 2019-10-31 LAB — GC/CHLAMYDIA PROBE AMP (~~LOC~~) NOT AT ARMC
Chlamydia: NEGATIVE
Comment: NEGATIVE
Comment: NORMAL
Neisseria Gonorrhea: NEGATIVE

## 2019-10-31 NOTE — Telephone Encounter (Signed)
Called to notify patient of upcoming procedure date/ time and instructions.  Patient advised to arrive to [facility] at 7am on 11/02/19 and to be NPO.  Advised patient to go to [facility] today for COVID screening.    Patient did have question about if father of baby could accompany her.  I will reach out to pre-op to answer this question and advise patient.

## 2019-10-31 NOTE — Telephone Encounter (Signed)
Reviewed arrival process and preop instructions of NPO after MN.  Pt verbalized understanding.  All questions and concerns addressed.

## 2019-10-31 NOTE — Consult Note (Signed)
Maternal-Fetal Medicine Name: Kaylee Kim MRN: [id number] Requesting Provider: Jaynie Collins, MD  Patient returned for cervical length measurement. On ultrasound performed last week, the shortest cervical length measurement was 2.4 cm. She had opted not to have rescue cerclage last week.  Patient has a history of preterm delivery at 23 weeks in 2012 followed by a preterm delivery in 2015 at 34 weeks' gestation.  She takes weekly prophylactic progesterone injections.  She does not have vaginal bleeding but reports some intermittent pelvic pressure.  On today's ultrasound, amniotic fluid is normal and good fetal activity is seen. After explaining, we performed transvaginal ultrasound to evaluate the cervix. The resting cervical length was 3.8 cm that spontaneously decreased to 1.9 cm. On transfundal pressure, the cervical length measurement was 1.3 cm with funneling. "Debris" (mucus) was seen in the cervical canal.  Cervical incompetence: I counseled the patient on the following: -I explained the diagnosis with help of ultrasound images. -History of preterm deliveries increase the risk of recurrent preterm deliveries.  -If cervical incompetence is diagnosed in patients with history of preterm deliveries, we recommend rescue cerclage procedure. Cerclage is associated with better outcomes. It does not, however, guarantee carrying pregnancy to term. -I explained cerclage procedure and its possible complications including miscarriage, infection, bleeding and injuries to bladder or bowel (rare). Performed by vaginal route and, usually, under spinal anesthesia. -Alternatively, the patient can continue expectant management. My recommendation would be to have rescue cerclage procedure. -Vaginal progesterone is not given when the patient is already taking intramuscular progesterone. I do not recommend discontinuing progesterone. -If patient undergoes cerclage procedure, intramuscular progesterone should  be continued.  After counseling, the patient opted to have rescue cerclage.  I discussed with Dr. Alysia Penna (ob second attending), who will be scheduling and performing the procedure.  Recommendations: -Rescue cerclage. -Progesterone (Makena) to continue after cerclage. -An appointment was made for her to return in 2 weeks for completion of fetal anatomy and cervical length measurement (transvaginal ultrasound). Consultation including face-to-face counseling: 30 minutes.

## 2019-11-01 ENCOUNTER — Other Ambulatory Visit (HOSPITAL_COMMUNITY): Payer: Medicaid Other

## 2019-11-02 ENCOUNTER — Inpatient Hospital Stay (HOSPITAL_COMMUNITY): Payer: Medicaid Other | Admitting: Anesthesiology

## 2019-11-02 ENCOUNTER — Observation Stay (HOSPITAL_COMMUNITY)
Admission: RE | Admit: 2019-11-02 | Discharge: 2019-11-02 | Disposition: A | Discharge status: Home / Self Care | Payer: Medicaid Other | Attending: Obstetrics and Gynecology | Admitting: Obstetrics and Gynecology

## 2019-11-02 ENCOUNTER — Other Ambulatory Visit: Payer: Self-pay

## 2019-11-02 ENCOUNTER — Encounter (HOSPITAL_COMMUNITY): Payer: Self-pay | Admitting: Obstetrics and Gynecology

## 2019-11-02 ENCOUNTER — Encounter (HOSPITAL_COMMUNITY)
Admission: RE | Disposition: A | Discharge status: Home / Self Care | Payer: Self-pay | Source: Home / Self Care | Attending: Obstetrics and Gynecology

## 2019-11-02 DIAGNOSIS — O99332 Smoking (tobacco) complicating pregnancy, second trimester: Secondary | ICD-10-CM | POA: Insufficient documentation

## 2019-11-02 DIAGNOSIS — Z3A22 22 weeks gestation of pregnancy: Secondary | ICD-10-CM | POA: Insufficient documentation

## 2019-11-02 DIAGNOSIS — O3432 Maternal care for cervical incompetence, second trimester: Principal | ICD-10-CM | POA: Insufficient documentation

## 2019-11-02 HISTORY — PX: CERVICAL CERCLAGE: SHX1329

## 2019-11-02 LAB — ABO/RH: ABO/RH(D): B POS

## 2019-11-02 LAB — CBC
HCT: 36.5 % (ref 36.0–46.0)
Hemoglobin: 12.2 g/dL (ref 12.0–15.0)
MCH: 30 pg (ref 26.0–34.0)
MCHC: 33.4 g/dL (ref 30.0–36.0)
MCV: 89.7 fL (ref 80.0–100.0)
Platelets: 244 10*3/uL (ref 150–400)
RBC: 4.07 MIL/uL (ref 3.87–5.11)
RDW: 14 % (ref 11.5–15.5)
WBC: 7.9 10*3/uL (ref 4.0–10.5)
nRBC: 0 % (ref 0.0–0.2)

## 2019-11-02 LAB — TYPE AND SCREEN
ABO/RH(D): B POS
Antibody Screen: NEGATIVE

## 2019-11-02 SURGERY — CERCLAGE, CERVIX, VAGINAL APPROACH
Anesthesia: Spinal

## 2019-11-02 MED ORDER — SODIUM CHLORIDE 0.9 % IR SOLN
Status: DC | PRN
  Administered 2019-11-02: 1

## 2019-11-02 MED ORDER — INDOMETHACIN 25 MG PO CAPS
100.0000 mg | ORAL_CAPSULE | ORAL | Status: AC
  Administered 2019-11-02: 11:00:00 100 mg via ORAL
  Filled 2019-11-02 (×2): qty 4

## 2019-11-02 MED ORDER — KETOROLAC TROMETHAMINE 30 MG/ML IJ SOLN
15.0000 mg | INTRAMUSCULAR | Status: AC
  Administered 2019-11-02: 15 mg via INTRAVENOUS

## 2019-11-02 MED ORDER — OXYCODONE HCL 5 MG PO TABS
5.0000 mg | ORAL_TABLET | Freq: Once | ORAL | Status: AC | PRN
  Administered 2019-11-02: 15:00:00 5 mg via ORAL

## 2019-11-02 MED ORDER — FENTANYL CITRATE (PF) 100 MCG/2ML IJ SOLN
INTRAMUSCULAR | Status: AC
  Filled 2019-11-02: qty 2

## 2019-11-02 MED ORDER — ACETAMINOPHEN 500 MG PO TABS
1000.0000 mg | ORAL_TABLET | ORAL | Status: AC
  Administered 2019-11-02: 11:00:00 1000 mg via ORAL

## 2019-11-02 MED ORDER — LACTATED RINGERS IV SOLN: INTRAVENOUS | Status: DC

## 2019-11-02 MED ORDER — KETOROLAC TROMETHAMINE 30 MG/ML IJ SOLN
INTRAMUSCULAR | Status: AC
  Filled 2019-11-02: qty 1

## 2019-11-02 MED ORDER — ACETAMINOPHEN 500 MG PO TABS
ORAL_TABLET | ORAL | Status: AC
  Filled 2019-11-02: qty 2

## 2019-11-02 MED ORDER — SODIUM CHLORIDE 0.9 % IV SOLN
3.0000 g | INTRAVENOUS | Status: AC
  Administered 2019-11-02: 12:00:00 3 g via INTRAVENOUS
  Filled 2019-11-02: qty 3

## 2019-11-02 MED ORDER — ONDANSETRON HCL 4 MG/2ML IJ SOLN: 4.0000 mg | Freq: Four times a day (QID) | INTRAMUSCULAR | Status: DC | PRN

## 2019-11-02 MED ORDER — FENTANYL CITRATE (PF) 100 MCG/2ML IJ SOLN
25.0000 ug | INTRAMUSCULAR | Status: DC | PRN
  Administered 2019-11-02: 50 ug via INTRAVENOUS
  Administered 2019-11-02 (×2): 25 ug via INTRAVENOUS

## 2019-11-02 MED ORDER — OXYCODONE HCL 5 MG PO TABS
ORAL_TABLET | ORAL | Status: AC
  Filled 2019-11-02: qty 1

## 2019-11-02 MED ORDER — SOD CITRATE-CITRIC ACID 500-334 MG/5ML PO SOLN
ORAL | Status: AC
  Filled 2019-11-02: qty 30

## 2019-11-02 MED ORDER — SOD CITRATE-CITRIC ACID 500-334 MG/5ML PO SOLN
30.0000 mL | ORAL | Status: AC
  Administered 2019-11-02: 11:00:00 30 mL via ORAL

## 2019-11-02 MED ORDER — OXYCODONE HCL 5 MG/5ML PO SOLN: 5.0000 mg | Freq: Once | ORAL | Status: AC | PRN

## 2019-11-02 MED ORDER — BUPIVACAINE IN DEXTROSE 0.75-8.25 % IT SOLN
INTRATHECAL | Status: DC | PRN
  Administered 2019-11-02: 1 mL via INTRATHECAL

## 2019-11-02 MED ORDER — DEXTROSE 5 % IV SOLN
INTRAVENOUS | Status: AC
  Filled 2019-11-02: qty 3000

## 2019-11-02 MED ORDER — LIDOCAINE 2% (20 MG/ML) 5 ML SYRINGE
INTRAMUSCULAR | Status: AC
  Filled 2019-11-02: qty 5

## 2019-11-02 SURGICAL SUPPLY — 17 items
CANISTER SUCT 3000ML PPV (MISCELLANEOUS) ×3 IMPLANT
CATH ROBINSON RED A/P 16FR (CATHETERS) ×3 IMPLANT
GLOVE BIO SURGEON STRL SZ7.5 (GLOVE) ×6 IMPLANT
GOWN STRL REUS W/TWL LRG LVL3 (GOWN DISPOSABLE) ×3 IMPLANT
GOWN STRL REUS W/TWL XL LVL3 (GOWN DISPOSABLE) ×3 IMPLANT
PACK VAGINAL MINOR WOMEN LF (CUSTOM PROCEDURE TRAY) ×3 IMPLANT
PAD OB MATERNITY 4.3X12.25 (PERSONAL CARE ITEMS) ×3 IMPLANT
PAD PREP 24X48 CUFFED NSTRL (MISCELLANEOUS) ×3 IMPLANT
SUT ETHIBOND 0 (SUTURE) ×3 IMPLANT
SUT MERSILENE FIBER S 5 CTX 12 (SUTURE) ×3 IMPLANT
SUT SILK 2 0 SH (SUTURE) ×3 IMPLANT
SYR BULB IRRIGATION 50ML (SYRINGE) ×3 IMPLANT
TOWEL OR 17X24 6PK STRL BLUE (TOWEL DISPOSABLE) ×6 IMPLANT
TRAY FOLEY W/BAG SLVR 14FR (SET/KITS/TRAYS/PACK) ×3 IMPLANT
TUBING NON-CON 1/4 X 20 CONN (TUBING) ×2 IMPLANT
TUBING NON-CON 1/4 X 20' CONN (TUBING) ×1
YANKAUER SUCT BULB TIP NO VENT (SUCTIONS) ×3 IMPLANT

## 2019-11-02 NOTE — Op Note (Signed)
Preoperative diagnosis: IUP 22 4/7 weeks, Incompetent cervix, History of preterm delivery x 2    Postoperative diagnosis: Same  Procedure: Cervical cerclage  Surgeon: Casimiro Needle L. Alysia Penna, M.D.  Anesthesia: Spinal  Findings: Shorten cervix, dilated @ 1 cm, no visible membranes noted  Estimated blood loss: 5 cc  Specimens: None  Reason for procedure: Kaylee Kim [redacted]w[redacted]d, with history as noted above and in H & P.  Procedure: Patient was taken to the operating room where spinal analgesia was administered. She was prepped and draped in the usual sterile fashion.  A Foley catheter was inserted.  A timeout was performed.  The patient was in dorsal lithotomy. The above cervical findings were noted. The vaginal vault was irrigated with NS.   A weighted speculum was placed inside the vagina.  A Deaver was used anteriorly. The cervix was grasped with  ring forceps at the 12 and 6 o'clock position.  A 5 mm Mersilene band, which had been interlaced with a 0 Ethibond, was placed in a pursestring fashion. Starting and ending at 12 o'clock.  The Mersilene was tied down. The Ethibond was tied  Then tied down as well to the point where a red rubber cathter would not pass through the endocervical cannel. The Mersilene knot then was secured with a silk suture to prevent the knot from backing out.  All instrument, needle and lap counts were correct x 2. The patient was taken to recovery in stable condition.  Marolyn Hammock ErvinMD 11/02/2019 12:14 PM

## 2019-11-02 NOTE — Anesthesia Procedure Notes (Signed)
Spinal  Patient location during procedure: OR Start time: 11/02/2019 11:24 AM End time: 11/02/2019 11:26 AM Staffing Performed: anesthesiologist  Anesthesiologist: Achille Rich, MD Preanesthetic Checklist Completed: patient identified, IV checked, risks and benefits discussed, surgical consent, monitors and equipment checked, pre-op evaluation and timeout performed Spinal Block Patient position: sitting Prep: DuraPrep Patient monitoring: cardiac monitor, continuous pulse ox and blood pressure Approach: midline Location: L3-4 Injection technique: single-shot Needle Needle type: Pencan  Needle gauge: 24 G Needle length: 9 cm Assessment Sensory level: T10 Additional Notes Functioning IV was confirmed and monitors were applied. Sterile prep and drape, including hand hygiene and sterile gloves were used. The patient was positioned and the spine was prepped. The skin was anesthetized with lidocaine.  Free flow of clear CSF was obtained prior to injecting local anesthetic into the CSF.  The spinal needle aspirated freely following injection.  The needle was carefully withdrawn.  The patient tolerated the procedure well.

## 2019-11-02 NOTE — Anesthesia Preprocedure Evaluation (Signed)
Anesthesia Evaluation  Patient identified by MRN, date of birth, ID band Patient awake    Reviewed: Allergy & Precautions, H&P , NPO status , Patient's Chart, lab work & pertinent test results  Airway Mallampati: I   Neck ROM: full    Dental   Pulmonary Current Smoker,    breath sounds clear to auscultation       Cardiovascular negative cardio ROS   Rhythm:regular Rate:Normal     Neuro/Psych    GI/Hepatic   Endo/Other    Renal/GU      Musculoskeletal   Abdominal   Peds  Hematology   Anesthesia Other Findings   Reproductive/Obstetrics (+) Pregnancy                             Anesthesia Physical Anesthesia Plan  ASA: I  Anesthesia Plan: Spinal   Post-op Pain Management:    Induction: Intravenous  PONV Risk Score and Plan: 1 and Treatment may vary due to age or medical condition  Airway Management Planned: Simple Face Mask  Additional Equipment:   Intra-op Plan:   Post-operative Plan:   Informed Consent: I have reviewed the patients History and Physical, chart, labs and discussed the procedure including the risks, benefits and alternatives for the proposed anesthesia with the patient or authorized representative who has indicated his/her understanding and acceptance.       Plan Discussed with: CRNA, Anesthesiologist and Surgeon  Anesthesia Plan Comments:         Anesthesia Quick Evaluation

## 2019-11-02 NOTE — Transfer of Care (Signed)
Immediate Anesthesia Transfer of Care Note  Patient: Kaylee Kim  Procedure(s) Performed: CERCLAGE CERVICAL (N/A )  Patient Location: PACU  Anesthesia Type:Spinal  Level of Consciousness: awake, alert  and oriented  Airway & Oxygen Therapy: Patient Spontanous Breathing  Post-op Assessment: Report given to RN and Post -op Vital signs reviewed and stable  Post vital signs: Reviewed and stable  Last Vitals:  Vitals Value Taken Time  BP 127/65 11/02/19 1215  Temp    Pulse 55 11/02/19 1217  Resp 16 11/02/19 1217  SpO2 100 % 11/02/19 1217  Vitals shown include unvalidated device data.  Last Pain:  Vitals:   11/02/19 0714  TempSrc: Oral  PainSc: 0-No pain         Complications: No apparent anesthesia complications

## 2019-11-02 NOTE — H&P (Signed)
Kaylee Kim is a 29 y.o. female [id number] IUP 22 4/7 presenting for cerclage placement due to incompetent cervix.  Know history of incompetent cervix with PTL at 23 weeks and 34 weeks. Took 17 OHP with last pregnancy. Currently taking with this pregnancy as well. U/S on Wednesday demonstrated a dynamic cervix with CL 1.9 - 1.3 cm with funneling with fundal pressure.  Cerclage recommended to pt by MFM. Pt agreed to placement.   OB History    Gravida  7   Para  2   Term  0   Preterm  2   AB  4   Living  2     SAB  1   TAB  3   Ectopic  0   Multiple  0   Live Births  2          Past Medical History:  Diagnosis Date  . Medical history non-contributory    Past Surgical History:  Procedure Laterality Date  . DILATION AND CURETTAGE OF UTERUS     Family History: family history includes Diabetes in her mother. Social History:  reports that she has been smoking cigars. She has been smoking about 3.00 packs per day. She has never used smokeless tobacco. She reports current drug use. Drug: Marijuana. She reports that she does not drink alcohol.      Review of Systems  Constitutional: Negative.   Respiratory: Negative.   Cardiovascular: Negative.   Gastrointestinal: Negative.   Genitourinary: Negative.    History   Blood pressure 131/70, pulse 84, temperature 98.3 F (36.8 C), temperature source Oral, resp. rate 20, height 5\' 4"  (1.626 m), weight 73.5 kg, last menstrual period 05/28/2019, SpO2 100 %. Exam Physical Exam  Constitutional: She appears well-developed and well-nourished.  Cardiovascular: Normal rate and regular rhythm.  Respiratory: Effort normal and breath sounds normal.  GI: Soft. Bowel sounds are normal.  FH = dates  Genitourinary:    Genitourinary Comments: Deferred to OR     Prenatal labs: ABO, Rh: --/--/B POS (02/26 0731) Antibody: NEG (02/26 0731) Rubella: 7.66 (02/03 1123) RPR: Non Reactive (02/03 1123)  HBsAg: Negative (02/03 1123)  HIV:  Non Reactive (02/03 1123)  GBS:     Assessment/Plan: IUP 22 4/7 weeks Incompetent cervix H/O PTD x 2  Pt is being admitted today for cerclage placement. R/B/Post op care and pelvic rest reviewed with pt. Pt has verbalized understanding and agrees to proceed.    6/7 11/02/2019, 9:57 AM

## 2019-11-02 NOTE — Discharge Instructions (Signed)
See hospital summary for D/C instructions-AVS

## 2019-11-02 NOTE — Anesthesia Postprocedure Evaluation (Signed)
Anesthesia Post Note  Patient: Kaylee Kim  Procedure(s) Performed: CERCLAGE CERVICAL (N/A )     Patient location during evaluation: PACU Anesthesia Type: Spinal Level of consciousness: oriented and awake and alert Pain management: pain level controlled Vital Signs Assessment: post-procedure vital signs reviewed and stable Respiratory status: spontaneous breathing, respiratory function stable and patient connected to nasal cannula oxygen Cardiovascular status: blood pressure returned to baseline and stable Postop Assessment: no headache, no backache and no apparent nausea or vomiting Anesthetic complications: no    Last Vitals:  Vitals:   11/02/19 1430 11/02/19 1500  BP: 126/73 132/81  Pulse: 73 78  Resp: 12 15  Temp:    SpO2: 99% 99%    Last Pain:  Vitals:   11/02/19 1454  TempSrc:   PainSc: 5    Pain Goal:    LLE Motor Response: Purposeful movement (11/02/19 1500) LLE Sensation: Tingling (11/02/19 1500) RLE Motor Response: Purposeful movement (11/02/19 1500) RLE Sensation: Tingling(states"thighs feel normal") (11/02/19 1500)     Epidural/Spinal Function Cutaneous sensation: Tingles(normal sensation in thighs) (11/02/19 1500), Patient able to flex knees: Yes (11/02/19 1500), Patient able to lift hips off bed: Yes (11/02/19 1500), Back pain beyond tenderness at insertion site: No (11/02/19 1500), Progressively worsening motor and/or sensory loss: No (11/02/19 1500), Bowel and/or bladder incontinence post epidural: No (11/02/19 1500)  Kaylee Kim S

## 2019-11-07 ENCOUNTER — Telehealth: Payer: Self-pay | Admitting: *Deleted

## 2019-11-07 ENCOUNTER — Telehealth (INDEPENDENT_AMBULATORY_CARE_PROVIDER_SITE_OTHER): Payer: Medicaid Other | Admitting: Obstetrics and Gynecology

## 2019-11-07 DIAGNOSIS — O0990 Supervision of high risk pregnancy, unspecified, unspecified trimester: Secondary | ICD-10-CM | POA: Diagnosis not present

## 2019-11-07 DIAGNOSIS — O3432 Maternal care for cervical incompetence, second trimester: Secondary | ICD-10-CM

## 2019-11-07 NOTE — Telephone Encounter (Signed)
Call received from [employer] representative - Sam. He stated that pt had triggered an elevated BP reading of 149/101.

## 2019-11-07 NOTE — Progress Notes (Addendum)
I connected with  Kaylee Kim on 11/07/19 at  8:15 AM EST by telephone and verified that I am speaking with the correct person using two identifiers.   I discussed the limitations, risks, security and privacy concerns of performing an evaluation and management service by telephone and the availability of in person appointments. I also discussed with the patient that there may be a patient responsible charge related to this service. The patient expressed understanding and agreed to proceed.  Pt does not have BP cuff. Denies s/s of hypertension. BP cuff ordered previously; called [facility] and verified pt's address for delivery. Staff member states it will be delivered today or tomorrow.  Marjo Bicker, RN 11/07/2019  8:14 AM

## 2019-11-07 NOTE — Progress Notes (Addendum)
   TELEHEALTH VIRTUAL OBSTETRICS VISIT ENCOUNTER NOTE  [facility]inic: [facility]  I connected with [name] on 11/07/19 at  8:15 AM EST by telephone at home and verified that I am speaking with the correct person using two identifiers.   I discussed the limitations, risks, security and privacy concerns of performing an evaluation and management service by telephone and the availability of in person appointments. I also discussed with the patient that there may be a patient responsible charge related to this service. The patient expressed understanding and agreed to proceed.  Subjective:  Kaylee Kim is a 29 y.o. [phone] at [redacted]w[redacted]d being followed for ongoing prenatal care.  She is currently monitored for the following issues for this high-risk pregnancy and has Previous preterm deliveries at 59 and 34 weeks, antepartum; Supervision of high risk pregnancy, antepartum; Short cervix, antepartum, second trimester; and Round ligament pain on their problem list.  Patient reports more sore at the [facility] shots. Reports fetal movement. Denies any contractions, bleeding or leaking of fluid.   The following portions of the patient's history were reviewed and updated as appropriate: allergies, current medications, past family history, past medical history, past social history, past surgical history and problem list.   Objective:  There were no vitals filed for this visit.  Babys[employer]s Data Reviewed: not applicable  General:  Alert, oriented and cooperative.   Mental Status: Normal mood and affect perceived. Normal judgment and thought content.  Rest of physical exam deferred due to type of encounter  Assessment and Plan:  Pregnancy: [id number] at [redacted]w[redacted]d 1. Supervision of high risk pregnancy, antepartum Routine care RN to work on getting BP cuff for patient Has u/s next week  2. Cervical cerclage suture present in second trimester No issues  3. H/o PTB Continue with 17p. Recommend  doing benadryl cream/steroid cream for day or two after giving herself injection.    Preterm labor symptoms and general obstetric precautions including but not limited to vaginal bleeding, contractions, leaking of fluid and fetal movement were reviewed in detail with the patient.  I discussed the assessment and treatment plan with the patient. The patient was provided an opportunity to ask questions and all were answered. The patient agreed with the plan and demonstrated an understanding of the instructions. The patient was advised to call back or seek an in-person office evaluation/go to MAU at [facility] for any urgent or concerning symptoms. Please refer to After Visit Summary for other counseling recommendations.   I provided 7 minutes of non-face-to-face time during this encounter. The visit was conducted via Mychart-medicine  Return in about 2 weeks (around 11/21/2019) for high risk, virtual visit.  Future Appointments  Date Time Provider Department Center  11/14/2019 11:00 AM [facility]-M[facility] NURSE WH-MFC MFC-US  11/14/2019 11:00 AM WH-MFC [place] 3 WH-MFCUS MFC-US    [place] Bing, MD [facility]

## 2019-11-07 NOTE — Telephone Encounter (Signed)
Called patient in response to Blood Pressure trigger from [employer]. Patient did not answer. LM for patient to call the office at her convenience. Sent My Chart Message.

## 2019-11-08 ENCOUNTER — Telehealth: Payer: Self-pay | Admitting: *Deleted

## 2019-11-08 NOTE — Telephone Encounter (Signed)
11:55 addendum - will send letter to patient since she has not answered her calls or read her MyChart messages.  Olin Gurski,RN

## 2019-11-08 NOTE — Telephone Encounter (Signed)
Received a [employer] alert phone call that Kaylee Kim BP 147/72 .  I called [facility] and left a message I was calling because we got an alert re: your bp  Please call us and let us know how you are- I am sending you a detailed MyChart message. Legrand Como

## 2019-11-09 ENCOUNTER — Encounter: Payer: Self-pay | Admitting: *Deleted

## 2019-11-09 ENCOUNTER — Telehealth: Payer: Self-pay

## 2019-11-09 ENCOUNTER — Telehealth: Payer: Self-pay | Admitting: *Deleted

## 2019-11-09 NOTE — Progress Notes (Signed)
Per message from Dr. Macon Large patient needs to be notified abnormal horizon results and genetic counseling. We have been unable to reach her by phone or mychart . Will send letter.  Zachrey Deutscher,RN

## 2019-11-09 NOTE — Telephone Encounter (Signed)
I called Kaylee Kim and left a message we have gotten another [employer] bp alert Since we have not been able to talk with you- and we close today for weekend- we need you to go to [facility] for evaluation for elevated bp. I will also send MyChart message .( I sent her a letter yesterday re: call us or go to hospital if readings high) LInda,RN

## 2019-11-09 NOTE — Telephone Encounter (Signed)
Called pt to have her schedule a appointment with Genetic Counseling with [facility], no answer ,leftVM OF [place] Contact # to schedule.

## 2019-11-09 NOTE — Telephone Encounter (Signed)
Received a phone call from [employer] with BP alert of 150/75 and that patient did not report any symptoms with reading Minoru Chap,RN

## 2019-11-12 ENCOUNTER — Telehealth: Payer: Self-pay

## 2019-11-12 NOTE — Telephone Encounter (Signed)
Received notification from [employer] that pt reported an elevated BP 147/65 and no report of any sx's.  Per chart review pt has had several reported blood pressure via [employer].  MyChart message sent.

## 2019-11-14 ENCOUNTER — Other Ambulatory Visit: Payer: Self-pay

## 2019-11-14 ENCOUNTER — Ambulatory Visit (HOSPITAL_COMMUNITY)
Admission: RE | Admit: 2019-11-14 | Discharge: 2019-11-14 | Disposition: A | Discharge status: Home / Self Care | Payer: Medicaid Other | Source: Ambulatory Visit | Attending: Obstetrics and Gynecology | Admitting: Obstetrics and Gynecology

## 2019-11-14 ENCOUNTER — Ambulatory Visit (HOSPITAL_COMMUNITY): Payer: Medicaid Other | Admitting: *Deleted

## 2019-11-14 ENCOUNTER — Encounter (HOSPITAL_COMMUNITY): Payer: Self-pay

## 2019-11-14 ENCOUNTER — Telehealth: Payer: Self-pay

## 2019-11-14 ENCOUNTER — Other Ambulatory Visit (HOSPITAL_COMMUNITY): Payer: Self-pay | Admitting: *Deleted

## 2019-11-14 DIAGNOSIS — O26872 Cervical shortening, second trimester: Secondary | ICD-10-CM

## 2019-11-14 DIAGNOSIS — O3432 Maternal care for cervical incompetence, second trimester: Secondary | ICD-10-CM

## 2019-11-14 DIAGNOSIS — O09212 Supervision of pregnancy with history of pre-term labor, second trimester: Secondary | ICD-10-CM | POA: Diagnosis not present

## 2019-11-14 DIAGNOSIS — Z362 Encounter for other antenatal screening follow-up: Secondary | ICD-10-CM | POA: Insufficient documentation

## 2019-11-14 DIAGNOSIS — Z3686 Encounter for antenatal screening for cervical length: Secondary | ICD-10-CM | POA: Diagnosis not present

## 2019-11-14 DIAGNOSIS — Z3A24 24 weeks gestation of pregnancy: Secondary | ICD-10-CM

## 2019-11-14 DIAGNOSIS — O0990 Supervision of high risk pregnancy, unspecified, unspecified trimester: Secondary | ICD-10-CM | POA: Insufficient documentation

## 2019-11-14 DIAGNOSIS — O09219 Supervision of pregnancy with history of pre-term labor, unspecified trimester: Secondary | ICD-10-CM

## 2019-11-14 NOTE — Telephone Encounter (Signed)
Left message for pt stating that I am calling in regards to her BP cuff from [facility].  If she could either send a MyChart message indicating that she has received it or to give the office a return call back.    Addison Naegeli, RN 11/14/19

## 2019-11-20 ENCOUNTER — Telehealth (INDEPENDENT_AMBULATORY_CARE_PROVIDER_SITE_OTHER): Payer: Medicaid Other | Admitting: Obstetrics and Gynecology

## 2019-11-20 DIAGNOSIS — Z148 Genetic carrier of other disease: Secondary | ICD-10-CM | POA: Insufficient documentation

## 2019-11-20 DIAGNOSIS — O26872 Cervical shortening, second trimester: Secondary | ICD-10-CM

## 2019-11-20 DIAGNOSIS — O09219 Supervision of pregnancy with history of pre-term labor, unspecified trimester: Secondary | ICD-10-CM

## 2019-11-20 DIAGNOSIS — Z5329 Procedure and treatment not carried out because of patient's decision for other reasons: Secondary | ICD-10-CM

## 2019-11-20 DIAGNOSIS — O0990 Supervision of high risk pregnancy, unspecified, unspecified trimester: Secondary | ICD-10-CM

## 2019-11-20 NOTE — Progress Notes (Signed)
Called pt at [phone]. VM left stating I am calling to check pt in for virtual appt and pt will need to reschedule with our front office since this is the second attempt to reach her. Callback number given.  Fleet Contras RN 11/20/19

## 2019-11-20 NOTE — Progress Notes (Signed)
@  919am attempted to call lvm advising of appointment and that I will call back in 5-10 mins to be available by phone

## 2019-11-27 ENCOUNTER — Telehealth: Payer: Self-pay

## 2019-11-27 NOTE — Telephone Encounter (Signed)
Received call from patients Maternity Care Manager Hinton Dyer, wanting to confirm patients phone# and address.Verified info was correct, Ms. Epps states has not spoken with patient in 2 months. She states will be sending her a letter stating that she will be dropped from program. Advised will make note in appointment note for patient to contact her.

## 2019-12-05 ENCOUNTER — Other Ambulatory Visit: Payer: Self-pay

## 2019-12-05 ENCOUNTER — Other Ambulatory Visit: Payer: Self-pay | Admitting: General Practice

## 2019-12-05 ENCOUNTER — Ambulatory Visit (INDEPENDENT_AMBULATORY_CARE_PROVIDER_SITE_OTHER): Payer: Medicaid Other | Admitting: Family Medicine

## 2019-12-05 ENCOUNTER — Other Ambulatory Visit: Payer: Medicaid Other

## 2019-12-05 VITALS — BP 120/74 | HR 86 | Wt 166.1 lb

## 2019-12-05 DIAGNOSIS — O26872 Cervical shortening, second trimester: Secondary | ICD-10-CM | POA: Diagnosis not present

## 2019-12-05 DIAGNOSIS — Z148 Genetic carrier of other disease: Secondary | ICD-10-CM | POA: Diagnosis not present

## 2019-12-05 DIAGNOSIS — O09212 Supervision of pregnancy with history of pre-term labor, second trimester: Secondary | ICD-10-CM

## 2019-12-05 DIAGNOSIS — Z23 Encounter for immunization: Secondary | ICD-10-CM

## 2019-12-05 DIAGNOSIS — O0990 Supervision of high risk pregnancy, unspecified, unspecified trimester: Secondary | ICD-10-CM

## 2019-12-05 DIAGNOSIS — Z3A27 27 weeks gestation of pregnancy: Secondary | ICD-10-CM | POA: Diagnosis not present

## 2019-12-05 DIAGNOSIS — O09219 Supervision of pregnancy with history of pre-term labor, unspecified trimester: Secondary | ICD-10-CM

## 2019-12-05 NOTE — Patient Instructions (Signed)

## 2019-12-05 NOTE — Progress Notes (Signed)
.     PRENATAL VISIT NOTE  Subjective:  Kaylee Kim is a 29 y.o. [phone] at [redacted]w[redacted]d being seen today for ongoing prenatal care.  She is currently monitored for the following issues for this high-risk pregnancy and has Previous preterm deliveries at 66 and 34 weeks, antepartum; Supervision of high risk pregnancy, antepartum; Short cervix, antepartum, second trimester; Round ligament pain; and Carrier of genetic defect on their problem list.  Patient reports pelvic pressure.  Contractions: Not present. Vag. Bleeding: None.  Movement: Present. Denies leaking of fluid.   The following portions of the patient's history were reviewed and updated as appropriate: allergies, current medications, past family history, past medical history, past social history, past surgical history and problem list.   Objective:   Vitals:   12/05/19 0841  BP: 120/74  Pulse: 86  Weight: 166 lb 1.6 oz (75.3 kg)    Fetal Status: Fetal Heart Rate (bpm): 145 Fundal Height: 26 cm Movement: Present     General:  Alert, oriented and cooperative. Patient is in no acute distress.  Skin: Skin is warm and dry. No rash noted.   Cardiovascular: Normal heart rate noted  Respiratory: Normal respiratory effort, no problems with respiration noted  Abdomen: Soft, gravid, appropriate for gestational age.  Pain/Pressure: Absent     Pelvic: Cervical exam deferred        Extremities: Normal range of motion.  Edema: None  Mental Status: Normal mood and affect. Normal behavior. Normal judgment and thought content.   Assessment and Plan:  Pregnancy: [id number] at [redacted]w[redacted]d 1. Supervision of high risk pregnancy, antepartum 28 wk labs and TDaP today  2. Previous preterm deliveries at 62 and 34 weeks, antepartum On 17 P--continue  3. Short cervix, antepartum, second trimester S/p cerclage placement   4. 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 Please refer to After Visit Summary for other counseling recommendations.   Return in 2 weeks (on 12/19/2019) for [facility], virtual.  Future Appointments  Date Time Provider Department Center  12/12/2019  3:45 PM [facility]-M[facility] NURSE WH-MFC MFC-US  12/12/2019  3:45 PM WH-MFC [place] 3 WH-MFCUS MFC-US    Reva Bores, MD

## 2019-12-06 LAB — RPR: RPR Ser Ql: NONREACTIVE

## 2019-12-06 LAB — CBC
Hematocrit: 38.3 % (ref 34.0–46.6)
Hemoglobin: 12.7 g/dL (ref 11.1–15.9)
MCH: 30.2 pg (ref 26.6–33.0)
MCHC: 33.2 g/dL (ref 31.5–35.7)
MCV: 91 fL (ref 79–97)
Platelets: 245 10*3/uL (ref 150–450)
RBC: 4.2 x10E6/uL (ref 3.77–5.28)
RDW: 13.2 % (ref 11.7–15.4)
WBC: 8.1 10*3/uL (ref 3.4–10.8)

## 2019-12-06 LAB — GLUCOSE TOLERANCE, 2 HOURS W/ 1HR
Glucose, 1 hour: 156 mg/dL (ref 65–179)
Glucose, 2 hour: 91 mg/dL (ref 65–152)
Glucose, Fasting: 72 mg/dL (ref 65–91)

## 2019-12-06 LAB — HIV ANTIBODY (ROUTINE TESTING W REFLEX): HIV Screen 4th Generation wRfx: NONREACTIVE

## 2019-12-12 ENCOUNTER — Ambulatory Visit (HOSPITAL_COMMUNITY): Admission: RE | Admit: 2019-12-12 | Payer: Medicaid Other | Source: Ambulatory Visit

## 2019-12-12 ENCOUNTER — Ambulatory Visit (HOSPITAL_COMMUNITY): Payer: Medicaid Other

## 2019-12-12 ENCOUNTER — Encounter (HOSPITAL_COMMUNITY): Payer: Self-pay

## 2019-12-18 ENCOUNTER — Telehealth: Payer: Self-pay | Admitting: *Deleted

## 2019-12-18 NOTE — Telephone Encounter (Signed)
Received a voicemail from Gwinn at [facility] stating he is calling re: partner testing and requests a call back.  I called [facility] and spoke with Jill Alexanders. He states he spoke with partner and he confirmed DOB year is 9. States female partner had DOB year one year off and he needs the order that was originally ordered by our office that was originally preapproved to be cancelled and a new order placed with correct DOB.  I informed him we may need to confirm partner DOB and then we will issue new order.  Shemekia Patane,RN

## 2019-12-18 NOTE — Telephone Encounter (Signed)
Per chart cannot verify partner information - do not see on original [facility] order. Do not see any phone calls re: partner information. I called [facility] and was unable to leave a message. Heard phone ring many time but no answer and no voicemail. Will send MyChart message.  Gitel Beste,RN

## 2019-12-19 ENCOUNTER — Encounter: Payer: Medicaid Other | Admitting: Obstetrics & Gynecology

## 2019-12-19 NOTE — Progress Notes (Signed)
355pm no answer line kept ringing to a busy signal  405pm no answer busy signal cant leave message

## 2019-12-20 NOTE — Progress Notes (Signed)
This encounter was created in error - please disregard.

## 2019-12-25 ENCOUNTER — Other Ambulatory Visit: Payer: Self-pay | Admitting: Family Medicine

## 2019-12-25 ENCOUNTER — Telehealth: Payer: Self-pay | Admitting: Family Medicine

## 2019-12-25 NOTE — Telephone Encounter (Signed)
Please call Jill Alexanders at [facility] [phone]. Needs change in the partner's order - needs to be canceled and reordered.

## 2019-12-26 NOTE — Telephone Encounter (Signed)
Called Justin at the appointed contact number and cancelled the prior order due to pt's [name] DOB incorrect.  New order placed while Jill Alexanders is on the phone.  Jill Alexanders explains that everything has been corrected and that [facility] will now reach out to the [name].    Addison Naegeli, RN  12/26/19

## 2019-12-26 NOTE — Telephone Encounter (Signed)
Pt issued addressed in another telephone encounter.

## 2019-12-29 DIAGNOSIS — O26893 Other specified pregnancy related conditions, third trimester: Secondary | ICD-10-CM | POA: Diagnosis not present

## 2019-12-29 DIAGNOSIS — Z3A30 30 weeks gestation of pregnancy: Secondary | ICD-10-CM | POA: Diagnosis not present

## 2019-12-29 DIAGNOSIS — Z87891 Personal history of nicotine dependence: Secondary | ICD-10-CM | POA: Diagnosis not present

## 2019-12-29 DIAGNOSIS — Z8349 Family history of other endocrine, nutritional and metabolic diseases: Secondary | ICD-10-CM | POA: Diagnosis not present

## 2019-12-29 DIAGNOSIS — Z833 Family history of diabetes mellitus: Secondary | ICD-10-CM | POA: Diagnosis not present

## 2019-12-29 DIAGNOSIS — R109 Unspecified abdominal pain: Secondary | ICD-10-CM | POA: Diagnosis not present

## 2019-12-29 DIAGNOSIS — O26853 Spotting complicating pregnancy, third trimester: Secondary | ICD-10-CM | POA: Diagnosis not present

## 2019-12-31 ENCOUNTER — Other Ambulatory Visit: Payer: Self-pay

## 2019-12-31 ENCOUNTER — Inpatient Hospital Stay (HOSPITAL_COMMUNITY): Payer: Medicaid Other | Admitting: Anesthesiology

## 2019-12-31 ENCOUNTER — Inpatient Hospital Stay (HOSPITAL_COMMUNITY)
Admission: AD | Admit: 2019-12-31 | Discharge: 2020-01-02 | DRG: 768 | Disposition: A | Discharge status: Home / Self Care | Payer: Medicaid Other | Attending: Obstetrics & Gynecology | Admitting: Obstetrics & Gynecology

## 2019-12-31 ENCOUNTER — Encounter (HOSPITAL_COMMUNITY): Payer: Self-pay | Admitting: Obstetrics and Gynecology

## 2019-12-31 DIAGNOSIS — Z20822 Contact with and (suspected) exposure to covid-19: Secondary | ICD-10-CM | POA: Diagnosis present

## 2019-12-31 DIAGNOSIS — O99324 Drug use complicating childbirth: Secondary | ICD-10-CM | POA: Diagnosis present

## 2019-12-31 DIAGNOSIS — O99334 Smoking (tobacco) complicating childbirth: Secondary | ICD-10-CM | POA: Diagnosis present

## 2019-12-31 DIAGNOSIS — Z148 Genetic carrier of other disease: Secondary | ICD-10-CM

## 2019-12-31 DIAGNOSIS — O26873 Cervical shortening, third trimester: Secondary | ICD-10-CM | POA: Diagnosis present

## 2019-12-31 DIAGNOSIS — O4703 False labor before 37 completed weeks of gestation, third trimester: Secondary | ICD-10-CM | POA: Diagnosis present

## 2019-12-31 DIAGNOSIS — O09219 Supervision of pregnancy with history of pre-term labor, unspecified trimester: Secondary | ICD-10-CM

## 2019-12-31 DIAGNOSIS — F1290 Cannabis use, unspecified, uncomplicated: Secondary | ICD-10-CM | POA: Diagnosis present

## 2019-12-31 DIAGNOSIS — F17290 Nicotine dependence, other tobacco product, uncomplicated: Secondary | ICD-10-CM | POA: Diagnosis present

## 2019-12-31 DIAGNOSIS — O26872 Cervical shortening, second trimester: Secondary | ICD-10-CM | POA: Diagnosis present

## 2019-12-31 DIAGNOSIS — Z3A31 31 weeks gestation of pregnancy: Secondary | ICD-10-CM

## 2019-12-31 DIAGNOSIS — O4593 Premature separation of placenta, unspecified, third trimester: Secondary | ICD-10-CM | POA: Diagnosis present

## 2019-12-31 DIAGNOSIS — O411230 Chorioamnionitis, third trimester, not applicable or unspecified: Secondary | ICD-10-CM | POA: Diagnosis not present

## 2019-12-31 DIAGNOSIS — O0990 Supervision of high risk pregnancy, unspecified, unspecified trimester: Secondary | ICD-10-CM

## 2019-12-31 LAB — URINALYSIS, ROUTINE W REFLEX MICROSCOPIC
Bilirubin Urine: NEGATIVE
Glucose, UA: NEGATIVE mg/dL
Ketones, ur: 20 mg/dL — AB
Nitrite: NEGATIVE
Protein, ur: 30 mg/dL — AB
RBC / HPF: >50 RBC/hpf — ABNORMAL HIGH (ref 0–5)
Specific Gravity, Urine: 1.015 (ref 1.005–1.030)
WBC, UA: >50 WBC/hpf — ABNORMAL HIGH (ref 0–5)
pH: 7 (ref 5.0–8.0)

## 2019-12-31 LAB — RESPIRATORY PANEL BY RT PCR (FLU A&B, COVID)
Influenza A by PCR: NEGATIVE
Influenza B by PCR: NEGATIVE
SARS Coronavirus 2 by RT PCR: NEGATIVE

## 2019-12-31 LAB — CBC
HCT: 35.9 % — ABNORMAL LOW (ref 36.0–46.0)
Hemoglobin: 11.9 g/dL — ABNORMAL LOW (ref 12.0–15.0)
MCH: 29.2 pg (ref 26.0–34.0)
MCHC: 33.1 g/dL (ref 30.0–36.0)
MCV: 88 fL (ref 80.0–100.0)
Platelets: 258 10*3/uL (ref 150–400)
RBC: 4.08 MIL/uL (ref 3.87–5.11)
RDW: 13.1 % (ref 11.5–15.5)
WBC: 14.6 10*3/uL — ABNORMAL HIGH (ref 4.0–10.5)
nRBC: 0 % (ref 0.0–0.2)

## 2019-12-31 LAB — WET PREP, GENITAL
Clue Cells Wet Prep HPF POC: NONE SEEN
Sperm: NONE SEEN
Trich, Wet Prep: NONE SEEN
Yeast Wet Prep HPF POC: NONE SEEN

## 2019-12-31 LAB — TYPE AND SCREEN
ABO/RH(D): B POS
Antibody Screen: NEGATIVE

## 2019-12-31 LAB — GC/CHLAMYDIA PROBE AMP (~~LOC~~) NOT AT ARMC
Chlamydia: NEGATIVE
Comment: NEGATIVE
Comment: NORMAL
Neisseria Gonorrhea: NEGATIVE

## 2019-12-31 LAB — RPR: RPR Ser Ql: NONREACTIVE

## 2019-12-31 MED ORDER — PRENATAL MULTIVITAMIN CH
1.0000 | ORAL_TABLET | Freq: Every day | ORAL | Status: DC
  Administered 2019-12-31 – 2020-01-01 (×2): 1 via ORAL
  Filled 2019-12-31 (×2): qty 1

## 2019-12-31 MED ORDER — PRENATAL MULTIVITAMIN CH: 1.0000 | ORAL_TABLET | Freq: Every day | ORAL | Status: DC

## 2019-12-31 MED ORDER — OXYTOCIN 40 UNITS IN NORMAL SALINE INFUSION - SIMPLE MED
1.0000 m[IU]/min | INTRAVENOUS | Status: DC
  Administered 2019-12-31: 2 m[IU]/min via INTRAVENOUS

## 2019-12-31 MED ORDER — COCONUT OIL OIL: 1.0000 "application " | TOPICAL_OIL | Status: DC | PRN

## 2019-12-31 MED ORDER — ONDANSETRON HCL 4 MG/2ML IJ SOLN: 4.0000 mg | Freq: Four times a day (QID) | INTRAMUSCULAR | Status: DC | PRN

## 2019-12-31 MED ORDER — LIDOCAINE HCL (PF) 1 % IJ SOLN: 30.0000 mL | INTRAMUSCULAR | Status: DC | PRN

## 2019-12-31 MED ORDER — ONDANSETRON HCL 4 MG/2ML IJ SOLN: 4.0000 mg | INTRAMUSCULAR | Status: DC | PRN

## 2019-12-31 MED ORDER — BENZOCAINE-MENTHOL 20-0.5 % EX AERO: 1.0000 "application " | INHALATION_SPRAY | CUTANEOUS | Status: DC | PRN

## 2019-12-31 MED ORDER — TERBUTALINE SULFATE 1 MG/ML IJ SOLN
0.2500 mg | Freq: Once | INTRAMUSCULAR | Status: AC
  Administered 2019-12-31: 0.25 mg via SUBCUTANEOUS
  Filled 2019-12-31: qty 1

## 2019-12-31 MED ORDER — OXYTOCIN 40 UNITS IN NORMAL SALINE INFUSION - SIMPLE MED: 2.5000 [IU]/h | INTRAVENOUS | Status: DC

## 2019-12-31 MED ORDER — OXYCODONE-ACETAMINOPHEN 5-325 MG PO TABS: 2.0000 | ORAL_TABLET | ORAL | Status: DC | PRN

## 2019-12-31 MED ORDER — ONDANSETRON HCL 4 MG PO TABS: 4.0000 mg | ORAL_TABLET | ORAL | Status: DC | PRN

## 2019-12-31 MED ORDER — PHENYLEPHRINE 40 MCG/ML (10ML) SYRINGE FOR IV PUSH (FOR BLOOD PRESSURE SUPPORT)
80.0000 ug | PREFILLED_SYRINGE | INTRAVENOUS | Status: DC | PRN
  Filled 2019-12-31: qty 10

## 2019-12-31 MED ORDER — CALCIUM CARBONATE 1250 (500 CA) MG PO CHEW
1000.00 | CHEWABLE_TABLET | ORAL | Status: DC
Start: ? — End: 2019-12-31

## 2019-12-31 MED ORDER — CALCIUM CARBONATE ANTACID 500 MG PO CHEW: 2.0000 | CHEWABLE_TABLET | ORAL | Status: DC | PRN

## 2019-12-31 MED ORDER — DIBUCAINE (PERIANAL) 1 % EX OINT: 1.0000 "application " | TOPICAL_OINTMENT | CUTANEOUS | Status: DC | PRN

## 2019-12-31 MED ORDER — WITCH HAZEL-GLYCERIN EX PADS: 1.0000 "application " | MEDICATED_PAD | CUTANEOUS | Status: DC | PRN

## 2019-12-31 MED ORDER — ACETAMINOPHEN 325 MG PO TABS
650.0000 mg | ORAL_TABLET | ORAL | Status: DC | PRN
  Administered 2019-12-31 – 2020-01-01 (×2): 650 mg via ORAL
  Filled 2019-12-31 (×2): qty 2

## 2019-12-31 MED ORDER — LACTATED RINGERS IV BOLUS
1000.0000 mL | Freq: Once | INTRAVENOUS | Status: AC
  Administered 2019-12-31: 1000 mL via INTRAVENOUS

## 2019-12-31 MED ORDER — LACTATED RINGERS IV SOLN: INTRAVENOUS | Status: DC

## 2019-12-31 MED ORDER — OXYTOCIN 40 UNITS IN NORMAL SALINE INFUSION - SIMPLE MED
INTRAVENOUS | Status: AC
  Filled 2019-12-31: qty 1000

## 2019-12-31 MED ORDER — FENTANYL-BUPIVACAINE-NACL 0.5-0.125-0.9 MG/250ML-% EP SOLN
12.0000 mL/h | EPIDURAL | Status: DC | PRN
  Filled 2019-12-31: qty 250

## 2019-12-31 MED ORDER — OXYCODONE-ACETAMINOPHEN 5-325 MG PO TABS: 1.0000 | ORAL_TABLET | ORAL | Status: DC | PRN

## 2019-12-31 MED ORDER — DIPHENHYDRAMINE HCL 25 MG PO CAPS
25.0000 mg | ORAL_CAPSULE | Freq: Four times a day (QID) | ORAL | Status: DC | PRN
  Administered 2020-01-02: 25 mg via ORAL
  Filled 2019-12-31: qty 1

## 2019-12-31 MED ORDER — SODIUM CHLORIDE (PF) 0.9 % IJ SOLN
INTRAMUSCULAR | Status: DC | PRN
  Administered 2019-12-31: 12 mL/h via EPIDURAL

## 2019-12-31 MED ORDER — DOCUSATE SODIUM 100 MG PO CAPS: 100.0000 mg | ORAL_CAPSULE | Freq: Every day | ORAL | Status: DC

## 2019-12-31 MED ORDER — ZOLPIDEM TARTRATE 5 MG PO TABS: 5.0000 mg | ORAL_TABLET | Freq: Every evening | ORAL | Status: DC | PRN

## 2019-12-31 MED ORDER — LACTATED RINGERS IV SOLN: 500.0000 mL | INTRAVENOUS | Status: DC | PRN

## 2019-12-31 MED ORDER — LIDOCAINE HCL (PF) 1 % IJ SOLN
INTRAMUSCULAR | Status: DC | PRN
  Administered 2019-12-31: 6 mL via EPIDURAL
  Administered 2019-12-31: 4 mL via EPIDURAL

## 2019-12-31 MED ORDER — SENNOSIDES-DOCUSATE SODIUM 8.6-50 MG PO TABS
2.0000 | ORAL_TABLET | ORAL | Status: DC
  Administered 2019-12-31 – 2020-01-01 (×2): 2 via ORAL
  Filled 2019-12-31 (×2): qty 2

## 2019-12-31 MED ORDER — MAGNESIUM SULFATE BOLUS VIA INFUSION
6.0000 g | Freq: Once | INTRAVENOUS | Status: AC
  Administered 2019-12-31: 6 g via INTRAVENOUS
  Filled 2019-12-31: qty 1000

## 2019-12-31 MED ORDER — PHENYLEPHRINE 40 MCG/ML (10ML) SYRINGE FOR IV PUSH (FOR BLOOD PRESSURE SUPPORT)
80.0000 ug | PREFILLED_SYRINGE | INTRAVENOUS | Status: DC | PRN
  Administered 2019-12-31 (×2): 80 ug via INTRAVENOUS

## 2019-12-31 MED ORDER — SIMETHICONE 80 MG PO CHEW: 80.0000 mg | CHEWABLE_TABLET | ORAL | Status: DC | PRN

## 2019-12-31 MED ORDER — EPHEDRINE 5 MG/ML INJ
10.0000 mg | INTRAVENOUS | Status: DC | PRN
  Filled 2019-12-31: qty 10

## 2019-12-31 MED ORDER — CALCIUM CARBONATE ANTACID 500 MG PO CHEW
400.0000 mg | CHEWABLE_TABLET | ORAL | Status: DC | PRN
  Administered 2019-12-31 (×2): 400 mg via ORAL
  Filled 2019-12-31 (×2): qty 2

## 2019-12-31 MED ORDER — OXYCODONE HCL 5 MG PO TABS: 5.0000 mg | ORAL_TABLET | ORAL | Status: DC | PRN

## 2019-12-31 MED ORDER — SOD CITRATE-CITRIC ACID 500-334 MG/5ML PO SOLN: 30.0000 mL | ORAL | Status: DC | PRN

## 2019-12-31 MED ORDER — IBUPROFEN 600 MG PO TABS
600.0000 mg | ORAL_TABLET | Freq: Four times a day (QID) | ORAL | Status: DC
  Administered 2019-12-31 – 2020-01-02 (×7): 600 mg via ORAL
  Filled 2019-12-31 (×8): qty 1

## 2019-12-31 MED ORDER — OXYTOCIN BOLUS FROM INFUSION
500.0000 mL | Freq: Once | INTRAVENOUS | Status: AC
  Administered 2019-12-31: 500 mL via INTRAVENOUS

## 2019-12-31 MED ORDER — NIFEDIPINE 10 MG PO CAPS
10.0000 mg | ORAL_CAPSULE | ORAL | Status: AC
  Administered 2019-12-31: 10 mg via ORAL
  Filled 2019-12-31 (×2): qty 1

## 2019-12-31 MED ORDER — DIPHENHYDRAMINE HCL 50 MG/ML IJ SOLN: 12.5000 mg | INTRAMUSCULAR | Status: DC | PRN

## 2019-12-31 MED ORDER — BETAMETHASONE SOD PHOS & ACET 6 (3-3) MG/ML IJ SUSP
12.0000 mg | INTRAMUSCULAR | Status: DC
  Administered 2019-12-31: 12 mg via INTRAMUSCULAR
  Filled 2019-12-31: qty 5

## 2019-12-31 MED ORDER — LACTATED RINGERS IV SOLN
500.0000 mL | Freq: Once | INTRAVENOUS | Status: AC
  Administered 2019-12-31: 500 mL via INTRAVENOUS

## 2019-12-31 MED ORDER — OXYCODONE HCL 5 MG PO TABS: 10.0000 mg | ORAL_TABLET | ORAL | Status: DC | PRN

## 2019-12-31 MED ORDER — TERBUTALINE SULFATE 1 MG/ML IJ SOLN: 0.2500 mg | Freq: Once | INTRAMUSCULAR | Status: DC | PRN

## 2019-12-31 MED ORDER — SODIUM CHLORIDE 0.9 % IV SOLN
2.0000 g | Freq: Four times a day (QID) | INTRAVENOUS | Status: DC
  Administered 2019-12-31: 2 g via INTRAVENOUS
  Filled 2019-12-31: qty 2000

## 2019-12-31 MED ORDER — ACETAMINOPHEN 325 MG PO TABS: 650.0000 mg | ORAL_TABLET | ORAL | Status: DC | PRN

## 2019-12-31 MED ORDER — EPHEDRINE 5 MG/ML INJ: 10.0000 mg | INTRAVENOUS | Status: DC | PRN

## 2019-12-31 MED ORDER — TETANUS-DIPHTH-ACELL PERTUSSIS 5-2.5-18.5 LF-MCG/0.5 IM SUSP: 0.5000 mL | Freq: Once | INTRAMUSCULAR | Status: DC

## 2019-12-31 MED ORDER — MAGNESIUM SULFATE 40 GM/1000ML IV SOLN
2.0000 g/h | INTRAVENOUS | Status: DC
  Administered 2019-12-31: 2 g/h via INTRAVENOUS
  Filled 2019-12-31: qty 1000

## 2019-12-31 NOTE — Lactation Note (Addendum)
This note was copied from a baby's chart. Lactation Consultation Note  Patient Name: Kaylee Kim [id number] Date: 12/31/2019 Reason for consult: Initial assessment;NICU baby;Preterm <34wks;Infant < 6lbs  LC in to visit with P3 Mom of 31 wk infant in the NICU.  Mom told RN she was "on the fence" about pumping for baby.  Mom was listed as Breastfeeding a few months ago, but came in today Formula.   Mom is unsure whether she wants to start pumping today.  Mom currently at baby's bedside.  Offered Mom NICU Lactation booklet to look through.  This is Mom's 3rd preterm baby, she pumped for 5 months with 2nd baby.  Mom started the process of getting WIC, but hasn't finished it.    Reassured Mom that her decision would be supported.  Encouraged her to ask her RN if she would like to start pumping and see Advertising copywriter.    Kaylee Kim 12/31/2019, 5:05 PM

## 2019-12-31 NOTE — Discharge Instructions (Signed)

## 2019-12-31 NOTE — MAU Note (Signed)
Cerclage removed by Jerilynn Birkenhead MD @0219 .

## 2019-12-31 NOTE — MAU Note (Signed)
Patient reports that on Saturday 12/29/19 @ 0930 she started contracting and she waited all day then proceeded to [facility] at 1800. Pt reports the doctors told her she was 0cm dilated and sent her home. Pt states since then she had been spotting and then tonight at 0030 she noticed heavier vaginal bleeding and clots in the toilet. Pt is unsure of FM at this time. Pt states she is currently ctx every 6 mins or less.

## 2019-12-31 NOTE — Anesthesia Preprocedure Evaluation (Signed)
Anesthesia Evaluation  Patient identified by MRN, date of birth, ID band Patient awake    Reviewed: Allergy & Precautions, H&P , NPO status , Patient's Chart, lab work & pertinent test results  History of Anesthesia Complications Negative for: history of anesthetic complications  Airway Mallampati: II  TM Distance: >3 FB Neck ROM: full    Dental no notable dental hx.    Pulmonary neg pulmonary ROS, Current Smoker,    Pulmonary exam normal        Cardiovascular negative cardio ROS Normal cardiovascular exam Rhythm:regular Rate:Normal     Neuro/Psych negative neurological ROS  negative psych ROS   GI/Hepatic negative GI ROS, Neg liver ROS,   Endo/Other  negative endocrine ROS  Renal/GU negative Renal ROS  negative genitourinary   Musculoskeletal   Abdominal   Peds  Hematology negative hematology ROS (+)   Anesthesia Other Findings   Reproductive/Obstetrics (+) Pregnancy                             Anesthesia Physical Anesthesia Plan  ASA: II  Anesthesia Plan: Epidural   Post-op Pain Management:    Induction:   PONV Risk Score and Plan:   Airway Management Planned:   Additional Equipment:   Intra-op Plan:   Post-operative Plan:   Informed Consent: I have reviewed the patients History and Physical, chart, labs and discussed the procedure including the risks, benefits and alternatives for the proposed anesthesia with the patient or authorized representative who has indicated his/her understanding and acceptance.       Plan Discussed with:   Anesthesia Plan Comments:         Anesthesia Quick Evaluation

## 2019-12-31 NOTE — Anesthesia Procedure Notes (Signed)
Epidural Patient location during procedure: OB Start time: 12/31/2019 6:16 AM End time: 12/31/2019 6:27 AM  Staffing Anesthesiologist: Lucretia Kern, MD Performed: anesthesiologist   Preanesthetic Checklist Completed: patient identified, IV checked, risks and benefits discussed, monitors and equipment checked, pre-op evaluation and timeout performed  Epidural Patient position: sitting Prep: DuraPrep Patient monitoring: heart rate, continuous pulse ox and blood pressure Approach: midline Location: L3-L4 Injection technique: LOR air  Needle:  Needle type: Tuohy  Needle gauge: 17 G Needle length: 9 cm Needle insertion depth: 6 cm Catheter type: closed end flexible Catheter size: 19 Gauge Catheter at skin depth: 11 cm Test dose: negative  Assessment Events: blood not aspirated, injection not painful, no injection resistance, no paresthesia and negative IV test  Additional Notes Reason for block:procedure for pain

## 2019-12-31 NOTE — Progress Notes (Signed)
Patient ID: Kaylee Kim, female   DOB: May 25, 1991, 29 y.o.   MRN: [id number] Pt still very uncomfortable with uterine contraction, but states have slowed down some. SVE 6/80/-3  A/P IUP 31 0/7 weeks       PTL  Pt has continued to contract dispite Magnesium and now with advance cervical dilation. Will transfer to L & D for delivery. POC. Reviewed with pt. NICU

## 2019-12-31 NOTE — Anesthesia Postprocedure Evaluation (Signed)
Anesthesia Post Note  Patient: Kaylee Kim  Procedure(s) Performed: AN AD HOC LABOR EPIDURAL     Patient location during evaluation: Mother Baby Anesthesia Type: Epidural Level of consciousness: awake and alert Pain management: pain level controlled Vital Signs Assessment: post-procedure vital signs reviewed and stable Respiratory status: spontaneous breathing, nonlabored ventilation and respiratory function stable Cardiovascular status: stable Postop Assessment: no headache, no backache and epidural receding Anesthetic complications: no    Last Vitals:  Vitals:   12/31/19 1120 12/31/19 1238  BP: (!) 119/56 124/68  Pulse: 83 88  Resp: 20 20  Temp:  36.6 C  SpO2:  100%    Last Pain:  Vitals:   12/31/19 1240  TempSrc:   PainSc: 0-No pain   Pain Goal: Patients Stated Pain Goal: 0 (12/31/19 0206)                 Eliceo Gladu

## 2019-12-31 NOTE — Discharge Summary (Signed)
Postpartum Discharge Summary    Patient Name: Kaylee Kim DOB: 01/30/1991 MRN: [id number]  Date of admission: 12/31/2019 Delivering Provider: Merilyn Baba   Date of discharge: 01/02/2020  Admitting diagnosis: Preterm uterine contractions in third trimester, antepartum [O47.03] Indication for care in labor or delivery [O75.9] Intrauterine pregnancy: [redacted]w[redacted]d    Secondary diagnosis:  Principal Problem:   Preterm labor in third trimester with preterm delivery Active Problems:   Previous preterm deliveries at 222and 34 weeks, antepartum   Postpartum care following vaginal delivery   Short cervix, antepartum, second trimester   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 yo [id number] admitted in Latent Labor on 12/31/2019. Patient had an uncomplicated labor course as follows:   Patient with cerclage and history of preterm labor [facility] when she was found to be in preterm labor. Her cerclage was removed due to heavy vaginal bleeding and contractions. She continued to labor despite magnesium and was transferred to L&D. She progressed to 8 cm, and due to imminent delivery, was AROMed and pitocin was started. She progressed to complete and delivered shortly thereafter.  Membrane Rupture Time/Date: 9:03 AM ,12/31/2019   Intrapartum Procedures: Episiotomy: None [1]                                         Lacerations:  None [1]  Patient had a delivery of a Viable infant on 12/31/2019  Information for the patient's newborn:  WrJazell, [place]0[[id number]]  Patient had an uncomplicated  postpartum course.  She is ambulating, tolerating a regular diet, passing flatus, and urinating well. Patient is discharged home in stable condition on 01/02/20.  Delivery time: 9:39 AM    Magnesium Sulfate received: Yes BMZ received: Yes x1 Rhophylac:N/A MMR:N/A Transfusion:No  Physical exam  Vitals:   01/01/20 1524 01/01/20 2236 01/02/20 0505 01/02/20 0924  BP: (!) 111/52 (!) 102/55 108/60 137/75  Pulse: (!) 57 (!) 54 64 80  Resp: 18 17 18 18   Temp: 98.2 F (36.8 C) 98.4 F (36.9 C) 97.8 F (36.6 C) 98 F (36.7 C)  TempSrc: Oral Oral Oral Oral  SpO2: 100% 100% 99% 99%  Weight:      Height:       General: alert, cooperative and no distress Lochia: appropriate Uterine Fundus: firm Incision: N/A DVT Evaluation: No evidence of DVT seen on physical exam. Negative Homan's sign. No cords or calf tenderness. No significant calf/ankle edema. Labs: Lab Results  Component Value Date   WBC 17.5 (H) 01/01/2020  HGB 10.6 (L) 01/01/2020   HCT 31.9 (L) 01/01/2020   MCV 87.6 01/01/2020   PLT 275 01/01/2020   CMP Latest Ref Rng & Units 10/10/2019  Glucose 65 - 99 mg/dL 82  BUN 6 - 20 mg/dL 6  Creatinine 0.57 - 1.00 mg/dL 0.54(L)  Sodium 134 - 144 mmol/L 135  Potassium 3.5 - 5.2 mmol/L 4.3  Chloride 96 - 106 mmol/L 102  CO2 20 - 29 mmol/L 20  Calcium 8.7 - 10.2 mg/dL 9.4  Total Protein 6.0 - 8.5 g/dL 6.7  Total Bilirubin 0.0 - 1.2 mg/dL <0.2  Alkaline Phos 39 - 117 IU/L 101  AST 0 - 40 IU/L 17  ALT 0 - 32 IU/L 9   Edinburgh Score: Edinburgh Postnatal Depression Scale Screening Tool 01/02/2020  I have been able to laugh and see the funny side of things. 0  I have looked forward with enjoyment to things. 1  I have blamed myself unnecessarily when things went wrong. 2  I have been anxious or worried for no good reason. 1  I have felt scared or panicky for no good reason. 1  Things have been getting on top of me. 1  I have been so unhappy that I have had difficulty  sleeping. 1  I have felt sad or miserable. 2  I have been so unhappy that I have been crying. 2  The thought of harming myself has occurred to me. 0  Edinburgh Postnatal Depression Scale Total 11    Discharge instruction: per After Visit Summary and "Baby and Me Booklet".  After visit meds:  Allergies as of 01/02/2020   No Known Allergies     Medication List    STOP taking these medications   aspirin EC 81 MG tablet   prenatal multivitamin Tabs tablet     TAKE these medications   acetaminophen 500 MG tablet Commonly known as: TYLENOL Take 1,500 mg by mouth every 6 (six) hours as needed for mild pain.   BIOTIN PO Take 2 capsules by mouth daily.   cetirizine 10 MG tablet Commonly known as: ZYRTEC Take 10 mg by mouth daily.   docusate sodium 100 MG capsule Commonly known as: COLACE Take 1 capsule (100 mg total) by mouth 2 (two) times daily as needed for mild constipation or moderate constipation.   ibuprofen 800 MG tablet Commonly known as: ADVIL Take 1 tablet (800 mg total) by mouth 3 (three) times daily with meals as needed for fever, mild pain, moderate pain or cramping.   norgestimate-ethinyl estradiol 0.25-35 MG-MCG tablet Commonly known as: ORTHO-CYCLEN Take 1 tablet by mouth daily. Do not start earlier than one month after delivery Start taking on: Feb 03, 2020       Diet: routine diet  Activity: Advance as tolerated. Pelvic rest for 6 weeks.   Future Appointments  Date Time Provider [place]  01/29/2020 10:55 AM Ardean Larsen, Mervyn Skeeters, CNM [facility]  Will also be scheduled for mood check in one week.    Newborn Data: Live born female  Birth Weight: 3 lb 2.8 oz (1440 g) APGAR: 7, 8   Newborn Delivery   Birth date/time: 12/31/2019 09:39:00 Delivery type: Vaginal, Spontaneous      Baby Feeding: Bottle Disposition:NICU   01/02/2020 Verita Schneiders, MD

## 2019-12-31 NOTE — H&P (Signed)
Kaylee Kim is a 29 y.o. female presenting for painful contractions and vaginal bleeding in the setting of cervical cerclage. Patient states she experienced mild to moderate contractions with vaginal spotting beginning Saturday 12/29/2019 in the morning. She presented to an outside facility for evaluation and was discharged with instructions for close followup. Shortly after midnight she experienced more intense contractions and heavier vaginal bleeding. She estimates her contractions occur every 3-5 minutes and she rates her contraction pain as 10/10. She denies leaking of fluid, decreased fetal movement, fever, falls, or recent illness.   Patient's OB history includes preterm delivery x 2. She received weekly 17 P for her current pregnancy as well as her 2015 pregnancy. She is s/p placement of rescue cerclage in February 2021.  Prenatal History --[facility] --Dating by 13 week scan --Care initiated at 19w 2d --Low risk NIPS, negative AFP --Hx Preterm delivery at 17 and 34 weeks --Receives 17 P weekly --Rubella Immune --TDAP 12/05/2019   OB History    Gravida  7   Para  2   Term  0   Preterm  2   AB  4   Living  2     SAB  1   TAB  3   Ectopic  0   Multiple  0   Live Births  2          Past Medical History:  Diagnosis Date  . Medical history non-contributory    Past Surgical History:  Procedure Laterality Date  . CERVICAL CERCLAGE N/A 11/02/2019   Procedure: CERCLAGE CERVICAL;  Surgeon: Chancy Milroy, MD;  Location: [facility] LD ORS;  Service: Gynecology;  Laterality: N/A;  . DILATION AND CURETTAGE OF UTERUS     Family History: family history includes Diabetes in her mother. Social History:  reports that she has been smoking cigars. She has been smoking about 3.00 packs per day. She has never used smokeless tobacco. She reports previous drug use. Drug: Marijuana. She reports that she does not drink alcohol.     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  Genitourinary: Positive for vaginal bleeding.  Musculoskeletal: Positive for back pain.  All other systems reviewed and are negative.  Exam by:: Barrington Ellison MD  Blood pressure 119/60, pulse 93, temperature 97.9 F (36.6 C), temperature source Oral, resp. rate 20, height 5\' 4"  (1.626 m), weight 76.7 kg, last menstrual period 05/28/2019. Physical Exam  Nursing note and vitals reviewed. Constitutional: She is oriented to person, place, and time. She appears well-developed and well-nourished.  Cardiovascular: Normal rate and normal heart sounds.  Respiratory: Effort normal and breath sounds normal.  GI:  Gravid  Genitourinary:    Vaginal discharge present.     Genitourinary Comments: Moderate amount of bright red bleeding visible with insertion of speculum   Neurological: She is alert and oriented to person, place, and time.    Prenatal labs: ABO, Rh: --/--/B POS, B POS Performed at [facility], [place] [address], [place], [place] [address]  [phone]) Antibody: NEG (02/26 0731) Rubella: 7.66 (02/03 1123) RPR: Non Reactive (03/31 0845)  HBsAg: Negative (02/03 1123)  HIV: Non Reactive (03/31 0845)  GBS:  Unknown  Meds ordered this encounter  Medications  . NIFEdipine (PROCARDIA) capsule 10 mg  . lactated ringers bolus 1,000 mL  . terbutaline (BRETHINE) injection 0.25 mg  . betamethasone acetate-betamethasone sodium phosphate (CELESTONE) injection 12 mg  . magnesium bolus via infusion 6 g  . magnesium sulfate 40 grams in SWI 1000 mL OB infusion  . acetaminophen (TYLENOL) tablet 650 mg   . zolpidem (AMBIEN) tablet 5 mg  . docusate sodium (COLACE) capsule 100 mg  . calcium carbonate (TUMS - dosed in mg elemental calcium) chewable tablet 400 mg of elemental calcium  . prenatal multivitamin tablet 1 tablet   Assessment/Plan: --At bedside shortly after arrival for patient report of intense pelvic and rectal pressure  --Reassuring fetal tracing, recurrent variable decels noted --Toco: moderate contractions q 2-3 min --Dr. Morene Antu at bedside for removal of cerclage --Cervix closed --Interventions for preterm labor initiated as ordered --Per Dr. Alysia Penna, admit to [facility], CNM 12/31/2019, 3:10 AM

## 2019-12-31 NOTE — Progress Notes (Signed)
Patient informed that the ultrasound is considered a limited OB ultrasound and is not intended to be a complete ultrasound exam. Patient also informed that the ultrasound is not being completed with the intent of assessing for fetal or placental anomalies or any pelvic abnormalities.  Explained that the purpose of today's ultrasound is to assess for fetal position. Patient acknowledges the purpose of the exam and the limitations of the study.  VERTEX  Jerilynn Birkenhead, MD [facility] Family Medicine Fellow, [facility] for [employer], [facility]

## 2020-01-01 LAB — CBC
HCT: 31.9 % — ABNORMAL LOW (ref 36.0–46.0)
Hemoglobin: 10.6 g/dL — ABNORMAL LOW (ref 12.0–15.0)
MCH: 29.1 pg (ref 26.0–34.0)
MCHC: 33.2 g/dL (ref 30.0–36.0)
MCV: 87.6 fL (ref 80.0–100.0)
Platelets: 275 10*3/uL (ref 150–400)
RBC: 3.64 MIL/uL — ABNORMAL LOW (ref 3.87–5.11)
RDW: 13.2 % (ref 11.5–15.5)
WBC: 17.5 10*3/uL — ABNORMAL HIGH (ref 4.0–10.5)
nRBC: 0 % (ref 0.0–0.2)

## 2020-01-01 NOTE — Progress Notes (Signed)
Post Partum Day 1  Subjective: No complaints, up ad lib, voiding, tolerating PO and + flatus.  Baby is stable in NICU.  Objective: Blood pressure (!) 102/51, pulse (!) 54, temperature 97.7 F (36.5 C), temperature source Oral, resp. rate 17, height 5\' 4"  (1.626 m), weight 76.7 kg, last menstrual period 05/28/2019, SpO2 100 %.  Physical Exam:  General: alert and no distress Lochia: appropriate Uterine Fundus: firm DVT Evaluation: No evidence of DVT seen on physical exam. Negative Homan's sign. No cords or calf tenderness.  Recent Labs    12/31/19 0522 01/01/20 0707  HGB 11.9* 10.6*  HCT 35.9* 31.9*    Assessment/Plan: Plan for discharge tomorrow, Breastfeeding, Lactation consult and Contraception OCPs Routine postpartum care   LOS: 1 day   01/03/20, MD 01/01/2020, 9:54 AM

## 2020-01-01 NOTE — Plan of Care (Signed)
Bonding well with baby in NICU. Visits baby frequently. Good spirits.

## 2020-01-02 ENCOUNTER — Telehealth: Payer: Medicaid Other | Admitting: Obstetrics & Gynecology

## 2020-01-02 LAB — CULTURE, BETA STREP (GROUP B ONLY)

## 2020-01-02 LAB — SURGICAL PATHOLOGY

## 2020-01-02 MED ORDER — NORGESTIMATE-ETH ESTRADIOL 0.25-35 MG-MCG PO TABS: 1.0000 | ORAL_TABLET | Freq: Every day | ORAL | 11 refills | Status: DC

## 2020-01-02 MED ORDER — IBUPROFEN 800 MG PO TABS: 800.0000 mg | ORAL_TABLET | Freq: Three times a day (TID) | ORAL | 2 refills | Status: AC | PRN

## 2020-01-02 MED ORDER — DOCUSATE SODIUM 100 MG PO CAPS: 100.0000 mg | ORAL_CAPSULE | Freq: Two times a day (BID) | ORAL | 2 refills | Status: AC | PRN

## 2020-01-02 NOTE — Progress Notes (Signed)
Pt discharged via ambulation after discharge instructions given. All questions answered. Pt sent with all belongings. Pt in stable condition.

## 2020-01-02 NOTE — Progress Notes (Signed)
[name] discharged prior to CSW meeting with [name].  Blaine Hamper, MSW, LCSW Clinical Social Work [phone]
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
## 2020-01-10 ENCOUNTER — Inpatient Hospital Stay (HOSPITAL_COMMUNITY): Admission: RE | Admit: 2020-01-10 | Payer: Medicaid Other | Source: Ambulatory Visit

## 2020-01-10 ENCOUNTER — Ambulatory Visit (INDEPENDENT_AMBULATORY_CARE_PROVIDER_SITE_OTHER): Payer: Medicaid Other | Admitting: Clinical

## 2020-01-10 ENCOUNTER — Other Ambulatory Visit: Payer: Self-pay

## 2020-01-10 ENCOUNTER — Ambulatory Visit (HOSPITAL_COMMUNITY): Payer: Medicaid Other

## 2020-01-10 ENCOUNTER — Encounter (HOSPITAL_COMMUNITY): Payer: Self-pay

## 2020-01-10 ENCOUNTER — Ambulatory Visit: Payer: Self-pay

## 2020-01-10 DIAGNOSIS — Z658 Other specified problems related to psychosocial circumstances: Secondary | ICD-10-CM

## 2020-01-10 NOTE — Patient Instructions (Signed)
Center for [facility] at [facility] [address] [place], [place] [address] [phone]   [facility] primary care offices accepting new patients:   Primary Care at [facility] [address] [place], [place] [address] [phone]  [facility] [address] [place], [place] [address] [phone]  [facility] [address] [place], [place] [address] [phone]  [facility] [address] [place], [place] [address] [phone]  [facility] [address] [place],  [place]  [address] [phone]

## 2020-01-10 NOTE — BH Specialist Note (Signed)
Integrated Behavioral Health via Telemedicine Video Visit  01/10/2020 Kaylee Kim [id number]  Number of [place] visits: 1 Session Start time: 10:23  Session End time: 10:37 Total time: 14  Referring Provider: Verita Schneiders, MD Type of Visit: Video Patient/Family location: [facility], NICU [facility] Provider location: Center for [place] at [facility]  All persons participating in visit: Patient Kaylee Kim and Kaylee Kim    Confirmed patient's address: Yes  Confirmed patient's phone number: Yes  Any changes to demographics: No   Confirmed patient's insurance: Yes  Any changes to patient's insurance: No   Discussed confidentiality: Yes   I connected with Kaylee Kim  by a video enabled telemedicine application and verified that I am speaking with the correct person using two identifiers.     I discussed the limitations of evaluation and management by telemedicine and the availability of in person appointments.  I discussed that the purpose of this visit is to provide behavioral health care while limiting exposure to the novel coronavirus.   Discussed there is a possibility of technology failure and discussed alternative modes of communication if that failure occurs.  I discussed that engaging in this video visit, they consent to the provision of behavioral healthcare and the services will be billed under their insurance.  Patient and/or legal guardian expressed understanding and consented to video visit: Yes   PRESENTING CONCERNS: Patient and/or family reports the following symptoms/concerns: Pt states mild symptoms (fatigue, worry, anxiety, lack of concentration) postpartum, attributed to childbirth/baby in NICU; pt has a good support system, is sleeping and eating well, and no concern at this time.  Duration of problem: Postpartum; Severity of problem: mild  STRENGTHS (Protective Factors/Coping  Skills): Strong social support  GOALS ADDRESSED: Patient will: 1.  Increase knowledge and/or ability of: healthy habits  2.  Demonstrate ability to: Increase healthy adjustment to current life circumstances  INTERVENTIONS: Interventions utilized:  Supportive Counseling and Psychoeducation and/or Health Education Standardized Assessments completed: GAD-7 and PHQ 9  ASSESSMENT: Patient currently experiencing Psychosocial stress.   Patient may benefit from psychoeducation and brief therapeutic interventions regarding adjusting to current life stress.  PLAN: 1. Follow up with behavioral health clinician on : As needed; call Roselyn Reef at [phone] if symptoms increase 2. Behavioral recommendations:  -Continue taking prenatal vitamin daily until postpartum medical visit -Consider registering for and attending new mom support group at conehealthybaby.com for additional support -Continue allowing family and friends to provide practical support postpartum 3. Referral(s): Integrated Orthoptist (In Clinic) and [employer] Resources:  New mom support  I discussed the assessment and treatment plan with the patient and/or parent/guardian. They were provided an opportunity to ask questions and all were answered. They agreed with the plan and demonstrated an understanding of the instructions.   They were advised to call back or seek an in-person evaluation if the symptoms worsen or if the condition fails to improve as anticipated.  Kaylee Kim  Depression screen [facility] 2/9 01/10/2020 12/05/2019 11/07/2019 10/10/2019  Decreased Interest 1 1 0 3  Down, Depressed, Hopeless 0 0 0 0  PHQ - 2 Score 1 1 0 3  Altered sleeping 0 0 0 0  Tired, decreased energy 1 1 0 3  Change in appetite 0 0 0 0  Feeling bad or failure about yourself  0 0 0 1  Trouble concentrating 1 1 1  0  Moving slowly or fidgety/restless 0 0 0 0  Suicidal thoughts 0  0 0 0  PHQ-9 Score 3 3 1 7    GAD 7 : Generalized  Anxiety Score 01/10/2020 12/05/2019 11/07/2019 10/10/2019  Nervous, Anxious, on Edge 1 2 0 3  Control/stop worrying 1 2 0 1  Worry too much - different things 1 1 0 1  Trouble relaxing 0 0 0 0  Restless 0 0 0 0  Easily annoyed or irritable 0 1 0 1  Afraid - awful might happen 0 0 0 0  Total GAD 7 Score 3 6 0 6

## 2020-01-10 NOTE — Lactation Note (Addendum)
This note was copied from a baby's chart. Lactation Consultation Note  Patient Name: Kaylee Kim Date: 01/10/2020   Baby 107 days old and mother initially was undecided about pumping. Mother is pumping now with manual pump 2-3 times per day and states her breasts her more with this baby.  Discussed pumping frequency and importance of emptying breasts. Mother worried because she states initially was pumping 6-8 oz and now is only pumping 2-3 oz.  Discussed supply and demand. Mother is also taking Zyrtec.  Suggest using natural agents if possible to help with allergies such as cough drops, saline spray and mother seemed agreeable.   Faxed WIC pump referral and reinforced to pump at least q 3 hours for a minimum of 8 times per day.       Maternal Data    Feeding Feeding Type: Donor Breast Milk  LATCH Score                   Interventions    Lactation Tools Discussed/Used     Consult Status      Hardie Pulley 01/10/2020, 11:48 AM

## 2020-01-29 ENCOUNTER — Telehealth (INDEPENDENT_AMBULATORY_CARE_PROVIDER_SITE_OTHER): Payer: Medicaid Other | Admitting: Student

## 2020-01-29 ENCOUNTER — Other Ambulatory Visit: Payer: Self-pay

## 2020-01-29 ENCOUNTER — Ambulatory Visit: Payer: Self-pay

## 2020-01-29 DIAGNOSIS — Z1389 Encounter for screening for other disorder: Secondary | ICD-10-CM | POA: Diagnosis not present

## 2020-01-29 NOTE — Patient Instructions (Signed)
Patient not at home and not able to check her BP.

## 2020-01-29 NOTE — Progress Notes (Signed)
-      I connected with@ on 01/29/20 at 10:55 AM EDT by: Mychart video and verified that I am speaking with the correct person using two identifiers.  Patient is located at NICU at [facility] and provider is located at [employer] for [employer] at [employer] for Women .     The purpose of this virtual visit is to provide medical care while limiting exposure to the novel coronavirus. I discussed the limitations, risks, security and privacy concerns of performing an evaluation and management service by My Chart and the availability of in person appointments. I also discussed with the patient that there may be a patient responsible charge related to this service. By engaging in this virtual visit, you consent to the provision of healthcare.  Additionally, you authorize for your insurance to be billed for the services provided during this visit.  The patient expressed understanding and agreed to proceed.  The following staff members participated in the virtual visit: Salem Caster  Post Partum Visit Note Subjective:   Kaylee Kim is a 29 y.o. [phone] female being evaluated for postpartum followup.  She is 4 week postpartum following a normal spontaneous vaginal delivery at  35 gestational weeks.  I have fully reviewed the prenatal and intrapartum course; pregnancy complicated by preterm delivery.  Postpartum course has been uneventful although infant still in NICU. Baby is feeding by Unknown. Bleeding staining only. Bowel function is normal. Bladder function is normal. Patient is not sexually active. Contraception method is OCP (estrogen/progesterone). Postpartum depression screening: negative.  The following portions of the patient's history were reviewed and updated as appropriate: allergies, current medications, past family history, past medical history, past social history, past surgical history and problem list.  Review of Systems Pertinent items are noted in HPI.   Objective:  There were no vitals  filed for this visit.  Patient could not obtain vitals as she is rocking her infant in the NICU.        Assessment:    Healthy postpartum exam.  Plan:  Essential components of care per ACOG recommendations:  1.  Mood and well being: Patient with negative depression screening today. Reviewed local resources for support.  - Patient does not use tobacco. No  hx of drug use? No    2. Infant care and feeding:  -Patient currently breastmilk feeding? Yes  Working with lactation at the hospital -Social determinants of health (SDOH) reviewed in EPIC. No concerns  3. Sexuality, contraception and birth spacing - Patient does not want a pregnancy in the next year.  Desired family size is 3 children.  - Reviewed forms of contraception in tiered fashion. Patient desired 3 today.   - Discussed birth spacing of 31 months> patient is interested in BTL; will ask front desk to schedule patient for BTL appt.   4. Sleep and fatigue -Encouraged family/partner/community support of 4 hrs of uninterrupted sleep to help with mood and fatigue  5. Physical Recovery  - Discussed patients delivery and complications - Patient had a no degree laceration, perineal healing reviewed. Patient expressed understanding - Patient has urinary incontinence? No  - Patient is safe to resume physical and sexual activity  6.  Health Maintenance - Last pap smear done 10/2019 with negative HPV. NA Mammogram  7. No Chronic Disease - PCP follow up-NA  20 minutes of non-face-to-face time spent with the patient    Marylene Land, CNM Center for [employer], [facility]

## 2020-01-29 NOTE — Lactation Note (Signed)
This note was copied from a baby's chart. Lactation Consultation Note  Patient Name: Kaylee Kim [id number] Date: 01/29/2020 Reason for consult: Follow-up assessment;NICU baby;Late-preterm 34-36.6wks;Infant < 6lbs  Baby's RN asked LC to visit with Mom.  Baby is 8 weeks old.  Mom has been pumping at home using a [facility] DEBP and expressing 1-2 oz per pumping.  Mom states she has been pumping 4-6 times per day.  She got discouraged due to volume decreased from 3-4 oz per pumping.  Reassured Mom and encouraged her to increase pumping frequency, hold baby STS as much as possible, pump at baby's bedside.   LC set up DEBP in baby's room and encouraged Mom to pump while she is here.  Mom usually goes home to pump.  Mom aware of importance of frequent pumping and breast massage.  Mom is interested in the 72 hr protected breastfeeding.  RN will call LC to assist at first latch.    Encouraged STS and increase pumping frequency. Mom has frozen EBM at home.  Encouraged her to bring this to NICU frozen for milk lab to prepare for baby.  Baby is transitioning off donor milk this week.   Interventions Interventions: Breast feeding basics reviewed;Skin to skin;Breast massage;Hand express;DEBP  Lactation Tools Discussed/Used Tools: Pump;Flanges Flange Size: 27 Breast pump type: Double-Electric Breast Pump Pump Review: Setup, frequency, and cleaning;Milk Storage Initiated by:: Erby Pian RN IBCLC Date initiated:: 01/29/20   Consult Status Consult Status: Follow-up Date: 01/31/20 Follow-up type: In-patient    Judee Clara 01/29/2020, 10:48 AM

## 2020-02-06 ENCOUNTER — Telehealth: Payer: Self-pay | Admitting: Family Medicine

## 2020-02-06 NOTE — Telephone Encounter (Signed)
Attempted to contact patient to get her scheduled for a surgical consult appointment. No answer, left voicemail for patient to give the office a call back.

## 2020-02-13 ENCOUNTER — Encounter: Payer: Self-pay | Admitting: Student

## 2020-02-13 ENCOUNTER — Telehealth: Payer: Self-pay | Admitting: Clinical

## 2020-02-13 NOTE — Telephone Encounter (Signed)
Pt requests visit with [facility]; agrees to virtual visit tomorrow 02/14/20 at 10:15am.

## 2020-02-14 ENCOUNTER — Ambulatory Visit (INDEPENDENT_AMBULATORY_CARE_PROVIDER_SITE_OTHER): Payer: Medicaid Other | Admitting: Clinical

## 2020-02-14 ENCOUNTER — Other Ambulatory Visit: Payer: Self-pay

## 2020-02-14 DIAGNOSIS — Z658 Other specified problems related to psychosocial circumstances: Secondary | ICD-10-CM

## 2020-02-14 DIAGNOSIS — F4323 Adjustment disorder with mixed anxiety and depressed mood: Secondary | ICD-10-CM | POA: Diagnosis not present

## 2020-02-14 NOTE — BH Specialist Note (Signed)
Integrated Behavioral Health via Telemedicine Video (Caregility) Visit  02/14/2020 Kaylee Kim [id number]  Number of [place] Kim visits: 2 Session Start time: 10:19  Session End time: 11:07 Total time: 48  Referring Provider: Verita Schneiders, MD Type of Visit: Video Patient/Family location: Home [facility] Provider location: [facility] for [place] at [facility]  All persons participating in visit: Patient Kaylee Kim and Kaylee Kim    Confirmed patient's address: Yes  Confirmed patient's phone number: Yes  Any changes to demographics: No   Confirmed patient's insurance: Yes  Any changes to patient's insurance: No   Discussed confidentiality: at previous visit  I connected with Rocky Morel  by a video enabled telemedicine application (Caregility) and verified that I am speaking with the correct person using two identifiers.     I discussed the limitations of evaluation and management by telemedicine and the availability of in person appointments.  I discussed that the purpose of this visit is to provide behavioral health care while limiting exposure to the novel coronavirus.   Discussed there is a possibility of technology failure and discussed alternative modes of communication if that failure occurs.  I discussed that engaging in this virtual visit, they consent to the provision of behavioral healthcare and the services will be billed under their insurance.  Patient and/or legal guardian expressed understanding and consented to virtual visit: Yes   PRESENTING CONCERNS: Patient and/or family reports the following symptoms/concerns: Pt states her primary symptoms are anhedonia, depression, lack of appetite, irritability; feels best when she takes a walk outside with her friend, but no motivation. Pt's goal is to feel better quickly; open to implementing self-coping strategy and starting [facility] medication; requests information about ongoing  therapy and PCP options for continued care.  Duration of problem: Postpartum; Severity of problem: moderate  STRENGTHS (Protective Factors/Coping Skills): Strong social support; self-awareness  GOALS ADDRESSED: Patient will: 1.  Reduce symptoms of: anxiety and depression  2.  Increase knowledge and/or ability of: healthy habits and self-management skills  3.  Demonstrate ability to: Increase healthy adjustment to current life circumstances and Increase motivation to adhere to plan of care  INTERVENTIONS: Interventions utilized:  Veterinary surgeon, Psychoeducation and/or Health Education and Link to [employer] Standardized Assessments completed: GAD-7 and PHQ 9  ASSESSMENT: Patient currently experiencing Adjustment disorder with mixed anxiety and depressed mood and Psychosocial stress.   Patient may benefit from continued psychoeducation and brief therapeutic interventions regarding coping with symptoms of depression and anxiety .  PLAN: 1. Follow up with behavioral health clinician on : Two weeks. Call Lilybelle Mayeda at [phone] if needed prior to follow up visit 2. Behavioral recommendations:  -Continue taking prenatal vitamin with iron (while low appetite remains) -Consider pairing up high iron foods with high vitamin c foods (ex. Meat with green peppers, lentils with lemon juice, etc.) at least 3x/week -Continue to talk to best friend every morning -Daily goal: take one minute walk outside every day (indoors on poor weather days); check off on calendar.  -If feeling motivated after one minute walk, extend walk time for that day. Once home, write that on your calendar (how many minutes walked).  -List of PCP providers on After Visit Summary; consider establishing with PCP of choice -Consider establishing with ongoing therapist of choice (some options on After Visit Summary) 3. Referral(s): [place] (In Clinic)  I discussed the assessment and  treatment plan with the patient and/or parent/guardian. They were provided an opportunity to  ask questions and all were answered. They agreed with the plan and demonstrated an understanding of the instructions.   They were advised to call back or seek an in-person evaluation if the symptoms worsen or if the condition fails to improve as anticipated.  Valetta Close [facility]  Depression screen [facility] 2/9 02/14/2020 01/10/2020 12/05/2019 11/07/2019 10/10/2019  Decreased Interest 3 1 1  0 3  Down, Depressed, Hopeless 2 0 0 0 0  PHQ - 2 Score 5 1 1  0 3  Altered sleeping 0 0 0 0 0  Tired, decreased energy 2 1 1  0 3  Change in appetite 3 0 0 0 0  Feeling bad or failure about yourself  2 0 0 0 1  Trouble concentrating 0 1 1 1  0  Moving slowly or fidgety/restless 2 0 0 0 0  Suicidal thoughts 0 0 0 0 0  PHQ-9 Score 14 3 3 1 7    GAD 7 : Generalized Anxiety Score 02/14/2020 01/10/2020 12/05/2019 11/07/2019  Nervous, Anxious, on Edge 2 1 2  0  Control/stop worrying 2 1 2  0  Worry too much - different things 2 1 1  0  Trouble relaxing 0 0 0 0  Restless 0 0 0 0  Easily annoyed or irritable 2 0 1 0  Afraid - awful might happen 0 0 0 0  Total GAD 7 Score 8 3 6  0

## 2020-02-14 NOTE — Patient Instructions (Addendum)
[facility] primary care offices accepting new patients:   Primary Care at [facility] [address] [place], [place] [address] [phone]  [facility] [place], [place] [address] [place] and [facility] [address] [place], [place] [address] [place] [address] [place], [place] [address] [phone]  Patient [place]. [place],  [place]  [address] [phone]   What if I or someone I know is in crisis?   If you are thinking about harming yourself or having thoughts of suicide, or if you know someone who is, seek help right away.   Call your doctor or mental health care provider.   Call 911 or go to a hospital emergency room to get immediate help, or ask a friend or family member to help you do these things.   Call the [place] National Suicide Prevention Lifelines toll-free, 24-hour hotline at 1-800-273-TALK (479) 661-7722) or TTY: 628-461-4181 TTY (936)384-5661) to talk to a trained counselor.   If you are in crisis, make sure you are not left alone.    If someone else is in crisis, make sure he or she is not left alone   24 Hour :   [place] National Suicide Hotline: 726-351-2340  Therapeutic Alternative Mobile Crisis: 270 204 0226   [facility]  [address] [place], [place] [address]  [phone] or [phone]  [facility] of the [employer] (Domestic Violence, Rape & Victim Assistance)  ([phone]) [phone]  [place]  [address], [place]  [address]   [phone] or [phone]   [place]: [phone] (8am-4pm) or [phone] (after hours)           [facility], [address] [place], [place] Fax: [phone]  [email or web address]  *Interpreters available *Accepts Medicaid, Medicare, uninsured  [facility]   Mon-Fri: 8am-5pm [address], [place], [place] [phone](phone); [phone]) BloggerCourse.com  *Accepts Medicare  [facility] [place], Fri: 8am-4pm [address], [place], [place] (phone); [phone] (fax) TaskTown.es  *[place] Mon-Fri: 9am-5pm  [address] [place], [place] (phone); [phone]  [email or web address].bond-[email or web address]  *Accepts Medicaid  [facility] [address], [place], [place]  SalonLookup.es   [facility] [place], 8:30am-12pm/1pm-2:30pm [address], [place], [place] (phone); [phone] (fax) www.fspcares.org  *Accepts Medicaid, sliding-scale*Bilingual services available  [facility] Mon-Fri, 8am-7pm [place], [place]  [phone](phone); [phone]) www.famsolutions.org  *Accepts Medicaid *Bilingual services available  [facility] Mon-Fri: 8am-5pm, Saturday by appointment only [place], [place], [place] (phone); [phone] (fax) www.[email or web address]   [facility] [address], [place], [place], [place] [email or web address]  *Free & reduced services for uninsured and underinsured individuals *Bilingual services for Spanish-speaking clients 21 and under  [facility], [address], [place], [place]); [phone]) RunningConvention.de  *Bring your own interpreter at first visit *Accepts Medicare and [facility]  [place] Mon-Fri: 9am-5:30pm [address], [place], [place], [place] (phone), [phone] (fax) After hours crisis line: 518-011-1550 www.[email or web address]  *Accepts Medicare and Medicaid  [employer], 8am-6pm [address], [place], [place] (phone); [phone] (fax) http://[email or web address]  *Subsidized costs available  [facility]  [facility] Mon-Fri: 8am-4pm [address] [place], [place] [phone](QVZDG); [phone](fax) www.psychotherapeuticservices.com  *Accepts Medicaid  [facility] Same day access hours: Mon-Fri, 8:30-3pm Crisis hours: Mon-Fri, 8am-5pm [address], [place], [place]  [facility] Same day access hours: Mon-Fri, 8:30-3pm Crisis hours: Mon-Fri, 8am-8pm [address], [place], [place] [phone] (phone); ([phone] (fax) www.rhahealthservices.org  *Accepts Medicaid and Medicare  [facility] [place], [place], Fri: 9am-9pm Tues, Thurs: 9am-6pm [address] [place], [place], [place]  [phone] (phone); [phone] (fax) https://[email or web address]  *(Accepts Medicare and Medicaid; payment plans available)*Bilingual services available  [facility] [address], [place], [place] [phone] (phone); [phone] (fax) www.[email or web address]   [facility] [address], [place], [place]  [phone]  RackRewards.fr  *Bilingual services available  [facility] (Social and Emotional Learning) Mon-Thurs: 8am-8pm [address], [place], [place] [phone] (phone); [phone] (fax) ScrapbookLive.si  *Accepts Medicaid*Bilingual services available  [facility] [address], [place], [place] [phone] (phone) BrotherBig.at  *Accepts Medicaid *Bilingual services  available  [facility] Mon-Fri, 9am-4:45pm [address] [place], [place] [phone] (phone); [phone] (fax) http://[email or web address]-[email or web address]  *Accepts Medicare  [facility] Mon-Thurs: 8:30-8pm, Fri: 8:30am-7pm [address], [place], [place] (3rd floor) [phone] (phone); ([phone] (fax) https://www.[email or web address].[email or web address]  *Accepts Medicaid; income-based reduced rates available  [facility] Mon-Fri: 8am-5pm [address], [place], [place] [phone] (phone); ([phone] (fax) http://www.[email or web address]  *Accepts Medicaid*Bilingual services available  [facility] [address], [address] [place], [place], [place]  [phone] (phone); [phone] (fax) www.youthfocus.org  *Free emergency housing and clinical services for youth in crisis  [facility] (Mental Health Association of [place])  [address], [place] [phone] www.mhag.org  *Provides direct services to individuals in recovery from mental illness, including support groups, recovery skills classes, and one on one peer support  [employer] on Mental Illness) [place] helpline: 312-764-1430  https://namiguilford.org  *A community hub for information relating to local resources and services for the friends and families of individuals living alongside a mental health condition, as well as the individuals themselves. Classes and support groups also provided

## 2020-02-20 DIAGNOSIS — F331 Major depressive disorder, recurrent, moderate: Secondary | ICD-10-CM | POA: Diagnosis not present

## 2020-02-25 DIAGNOSIS — F331 Major depressive disorder, recurrent, moderate: Secondary | ICD-10-CM | POA: Diagnosis not present

## 2020-02-25 NOTE — BH Specialist Note (Signed)
Pt did not arrive to video visit and did not answer the phone ;.Left HIPPA-compliant message to call back Asher Muir from Center for [employer] at [facility] for Women at [phone] (main office) or [phone] (Valdis Bevill's office).  ; left MyChart message for patient.    Integrated Behavioral Health via Telemedicine Video (Caregility) Visit  02/25/2020 Kaylee Kim [id number]  Rae Lips

## 2020-02-28 ENCOUNTER — Ambulatory Visit: Payer: Medicaid Other | Admitting: Clinical

## 2020-02-28 ENCOUNTER — Other Ambulatory Visit: Payer: Self-pay

## 2020-02-28 DIAGNOSIS — Z91199 Patient's noncompliance with other medical treatment and regimen due to unspecified reason: Secondary | ICD-10-CM

## 2020-03-03 ENCOUNTER — Other Ambulatory Visit: Payer: Self-pay | Admitting: Student

## 2020-03-03 MED ORDER — SERTRALINE HCL 25 MG PO TABS: 25.0000 mg | ORAL_TABLET | Freq: Every day | ORAL | 1 refills | Status: AC

## 2020-03-03 NOTE — BH Specialist Note (Signed)
Integrated Behavioral Health via Telemedicine Phone (Caregility) Visit(Pt unable to use video at this time)  03/03/2020 Kaylee Kim [id number]  Number of Integrated Behavioral Health visits: 3 Session Start time: 8:18  Session End time: 8:33 Total time: 15  Referring Provider: Jaynie Collins, MD Type of Visit: Phone Patient/Family location: Home [facility] Provider location: Center for [facility] at [facility]  All persons participating in visit: Patient Kaylee Kim and [facility] Manuela Halbur    Confirmed patient's address: Yes  Confirmed patient's phone number: Yes  Any changes to demographics: No   Confirmed patient's insurance: Yes  Any changes to patient's insurance: No   Discussed confidentiality: at previous visit  I connected with Kaylee Kim  by a video enabled telemedicine application (Caregility) and verified that I am speaking with the correct person using two identifiers.     I discussed the limitations of evaluation and management by telemedicine and the availability of in person appointments.  I discussed that the purpose of this visit is to provide behavioral health care while limiting exposure to the novel coronavirus.   Discussed there is a possibility of technology failure and discussed alternative modes of communication if that failure occurs.  I discussed that engaging in this virtual visit, they consent to the provision of behavioral healthcare and the services will be billed under their insurance.  Patient and/or legal guardian expressed understanding and consented to virtual visit: Yes   PRESENTING CONCERNS: Patient and/or family reports the following symptoms/concerns: Pt states her primary symptom is poor appetite and mild irritability, is seeing therapist at [facility], and taking Zoloft 25mg  (mild stomach upset upon starting).  Duration of problem: Postpartum; Severity of problem: mild  STRENGTHS (Protective Factors/Coping Skills): Strong  social support, self-awareness; adhering to treatment via therapy and BH medication management  GOALS ADDRESSED: Patient will: 1.  Maintain reduction of symptoms of: anxiety and depression  2.  Demonstrate ability to: Increase healthy adjustment to current life circumstances  INTERVENTIONS: Interventions utilized:  Supportive Counseling and Medication Monitoring Standardized Assessments completed: GAD-7 and PHQ 9  ASSESSMENT: Patient currently experiencing Adjustment disorder with mixed anxiety and depressed mood and Psychosocial stress.   Patient may benefit from continued psychoeducation and brief therapeutic interventions regarding maintaining reduction of symptoms of anxiety and depression .  PLAN: 1. Follow up with behavioral health clinician on : As needed 2. Behavioral recommendations:  -Continue taking Zoloft as prescribed -Continue attending therapy sessions at [facility] -Continue using self-coping strategies that have been helpful to manage symptoms  3. Referral(s): Integrated (In Clinic)  I discussed the assessment and treatment plan with the patient and/or parent/guardian. They were provided an opportunity to ask questions and all were answered. They agreed with the plan and demonstrated an understanding of the instructions.   They were advised to call back or seek an in-person evaluation if the symptoms worsen or if the condition fails to improve as anticipated.  [employer] [facility]  Depression screen [facility] 2/9 03/17/2020 02/14/2020 01/10/2020 12/05/2019 11/07/2019  Decreased Interest 0 3 1 1  0  Down, Depressed, Hopeless 0 2 0 0 0  PHQ - 2 Score 0 5 1 1  0  Altered sleeping 0 0 0 0 0  Tired, decreased energy 1 2 1 1  0  Change in appetite 1 3 0 0 0  Feeling bad or failure about yourself  0 2 0 0 0  Trouble concentrating 0 0 1 1 1   Moving slowly or fidgety/restless 0 2  0 0 0  Suicidal thoughts 0 0 0 0 0  PHQ-9 Score 2 14 3 3 1    GAD 7 : Generalized  Anxiety Score 03/17/2020 02/14/2020 01/10/2020 12/05/2019  Nervous, Anxious, on Edge 0 2 1 2   Control/stop worrying 0 2 1 2   Worry too much - different things 0 2 1 1   Trouble relaxing 1 0 0 0  Restless 0 0 0 0  Easily annoyed or irritable 1 2 0 1  Afraid - awful might happen 0 0 0 0  Total GAD 7 Score 2 8 3  6

## 2020-03-06 ENCOUNTER — Telehealth: Payer: Self-pay | Admitting: Clinical

## 2020-03-06 NOTE — Telephone Encounter (Signed)
Attempt to contact pt for [facility] medication management; Left HIPPA-compliant message to call back Asher Muir from Center for [employer] at [facility] for [facility] at [phone] (main office) or [phone] (Izzy Doubek's office).

## 2020-03-17 ENCOUNTER — Other Ambulatory Visit: Payer: Self-pay

## 2020-03-17 ENCOUNTER — Ambulatory Visit (INDEPENDENT_AMBULATORY_CARE_PROVIDER_SITE_OTHER): Payer: Medicaid Other | Admitting: Clinical

## 2020-03-17 DIAGNOSIS — F4323 Adjustment disorder with mixed anxiety and depressed mood: Secondary | ICD-10-CM

## 2020-03-17 DIAGNOSIS — O99345 Other mental disorders complicating the puerperium: Secondary | ICD-10-CM | POA: Diagnosis not present

## 2020-03-17 DIAGNOSIS — Z658 Other specified problems related to psychosocial circumstances: Secondary | ICD-10-CM

## 2020-04-02 DIAGNOSIS — F331 Major depressive disorder, recurrent, moderate: Secondary | ICD-10-CM | POA: Diagnosis not present

## 2020-04-14 DIAGNOSIS — F331 Major depressive disorder, recurrent, moderate: Secondary | ICD-10-CM | POA: Diagnosis not present

## 2020-05-05 DIAGNOSIS — F331 Major depressive disorder, recurrent, moderate: Secondary | ICD-10-CM | POA: Diagnosis not present

## 2020-06-01 ENCOUNTER — Encounter (HOSPITAL_COMMUNITY): Payer: Self-pay | Admitting: Emergency Medicine

## 2020-06-01 ENCOUNTER — Other Ambulatory Visit: Payer: Self-pay

## 2020-06-01 ENCOUNTER — Emergency Department (HOSPITAL_COMMUNITY)
Admission: EM | Admit: 2020-06-01 | Discharge: 2020-06-01 | Disposition: A | Discharge status: Home / Self Care | Payer: Medicaid Other | Attending: Emergency Medicine | Admitting: Emergency Medicine

## 2020-06-01 DIAGNOSIS — F17290 Nicotine dependence, other tobacco product, uncomplicated: Secondary | ICD-10-CM | POA: Insufficient documentation

## 2020-06-01 DIAGNOSIS — Y906 Blood alcohol level of 120-199 mg/100 ml: Secondary | ICD-10-CM | POA: Diagnosis not present

## 2020-06-01 DIAGNOSIS — T50904A Poisoning by unspecified drugs, medicaments and biological substances, undetermined, initial encounter: Secondary | ICD-10-CM | POA: Diagnosis not present

## 2020-06-01 DIAGNOSIS — R4020 Unspecified coma: Secondary | ICD-10-CM | POA: Diagnosis not present

## 2020-06-01 DIAGNOSIS — R4182 Altered mental status, unspecified: Secondary | ICD-10-CM | POA: Diagnosis present

## 2020-06-01 DIAGNOSIS — T887XXA Unspecified adverse effect of drug or medicament, initial encounter: Secondary | ICD-10-CM | POA: Diagnosis not present

## 2020-06-01 DIAGNOSIS — F10920 Alcohol use, unspecified with intoxication, uncomplicated: Secondary | ICD-10-CM

## 2020-06-01 DIAGNOSIS — F10129 Alcohol abuse with intoxication, unspecified: Secondary | ICD-10-CM | POA: Insufficient documentation

## 2020-06-01 DIAGNOSIS — R404 Transient alteration of awareness: Secondary | ICD-10-CM | POA: Diagnosis not present

## 2020-06-01 LAB — ETHANOL: Alcohol, Ethyl (B): 153 mg/dL — ABNORMAL HIGH (ref <10)

## 2020-06-01 NOTE — ED Notes (Signed)
Patient ambulatory to and from the bathroom with a steady gait.  

## 2020-06-01 NOTE — ED Triage Notes (Signed)
Patient brought in by EMS from the [facility] after passing out in the bathroom stall.  Patient responds to name and attempts to follow commands.

## 2020-06-01 NOTE — ED Triage Notes (Signed)
Patient comes in intoxicated. No complaints.

## 2020-06-01 NOTE — ED Provider Notes (Signed)
[facility]-EMERGENCY DEPT Provider Note   CSN: [id number] Arrival date & time: 06/01/20  0104     History Chief Complaint  Patient presents with  . Alcohol Intoxication    Patient here for ETOH.    Kaylee Kim is a 29 y.o. female.  HPI     This is a 29 year old female brought in by EMS for concerns for intoxication.  Patient was reportedly found in the bathroom of a restaurant unresponsive.  Minimally responsive for EMS.  She was brought in for evaluation.  Patient does not contribute to history taking.  She wakes up enough to nod her head yes or no to questions.  She does endorse drinking alcohol.  She denies drug use.  She denies pain.  Level 5 caveat for intoxication  Past Medical History:  Diagnosis Date  . Medical history non-contributory     Patient Active Problem List   Diagnosis Date Noted  . 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 Postpartum care following vaginal delivery 10/10/2019  . Previous preterm deliveries at 33 and 34 weeks, antepartum 02/25/2014    Past Surgical History:  Procedure Laterality Date  . CERVICAL CERCLAGE N/A 11/02/2019   Procedure: CERCLAGE CERVICAL;  Surgeon: Hermina Staggers, MD;  Location: [facility] LD ORS;  Service: Gynecology;  Laterality: N/A;  . DILATION AND CURETTAGE OF UTERUS       OB History    Gravida  7   Para  2   Term  0   Preterm  2   AB  4   Living  2     SAB  1   TAB  3   Ectopic  0   Multiple  0   Live Births  2           Family History  Problem Relation Age of Onset  . Diabetes Mother     Social History   Tobacco Use  . Smoking status: Heavy Tobacco Smoker    Packs/day: 3.00    Years: 7.00    Pack years: 21.00    Types: Cigars  . Smokeless tobacco: Never Used  . Tobacco comment: stopped in August 2020  Vaping Use  . Vaping Use: Never used  Substance Use Topics  . Alcohol use: No  . Drug use: Not Currently    Types: Marijuana    Comment: used a month ago     Home Medications Prior to Admission medications   Medication Sig Start Date End Date Taking? Authorizing Provider  acetaminophen (TYLENOL) 500 MG tablet Take 1,500 mg by mouth every 6 (six) hours as needed for mild pain.    [provider]  BIOTIN PO Take 2 capsules by mouth daily.    [provider]  cetirizine (ZYRTEC) 10 MG tablet Take 10 mg by mouth daily.    [provider]  docusate sodium (COLACE) 100 MG capsule Take 1 capsule (100 mg total) by mouth 2 (two) times daily as needed for mild constipation or moderate constipation. 01/02/20   Anyanwu, Jethro Bastos, MD  ibuprofen (ADVIL) 800 MG tablet Take 1 tablet (800 mg total) by mouth 3 (three) times daily with meals as needed for fever, mild pain, moderate pain or cramping. 01/02/20   Anyanwu, Jethro Bastos, MD  norgestimate-ethinyl estradiol (ORTHO-CYCLEN) 0.25-35 MG-MCG tablet Take 1 tablet by mouth daily. Do not start earlier than one month after delivery 02/03/20   Anyanwu, Jethro Bastos, MD  sertraline (ZOLOFT) 25 MG tablet Take 1 tablet (  25 mg total) by mouth daily. 03/03/20   Marylene Land, CNM    Allergies    Patient has no known allergies.  Review of Systems   Review of Systems  Unable to perform ROS: Patient unresponsive    Physical Exam Updated Vital Signs BP 104/61 (BP Location: Right Arm)   Pulse 61   Temp (!) 97.4 F (36.3 C) (Oral)   Resp 15   Ht 1.626 m (5\' 4" )   Wt 77.1 kg   SpO2 100%   BMI 29.18 kg/m   Physical Exam Vitals and nursing note reviewed.  Constitutional:      Appearance: She is well-developed. She is not ill-appearing.     Comments: ABCs intact, somnolent but arousable  HENT:     Head: Normocephalic and atraumatic.     Nose: Nose normal.     Mouth/Throat:     Mouth: Mucous membranes are moist.  Eyes:     Pupils: Pupils are equal, round, and reactive to light.  Cardiovascular:     Rate and Rhythm: Normal rate and regular rhythm.     Heart sounds: Normal  heart sounds.  Pulmonary:     Effort: Pulmonary effort is normal. No respiratory distress.     Breath sounds: No wheezing.  Abdominal:     Palpations: Abdomen is soft.     Tenderness: There is no abdominal tenderness.  Musculoskeletal:        General: No tenderness, deformity or signs of injury.     Cervical back: Neck supple.  Skin:    General: Skin is warm and dry.  Neurological:     Mental Status: She is alert.     Comments: Somnolent but arousable to tactile stimuli,follows simple commands, moves all 4 extremities spontaneously  Psychiatric:     Comments: Unable to assess     ED Results / Procedures / Treatments   Labs (all labs ordered are listed, but only abnormal results are displayed) Labs Reviewed  ETHANOL - Abnormal; Notable for the following components:      Result Value   Alcohol, Ethyl (B) 153 (*)    All other components within normal limits    EKG None  Radiology No results found.  Procedures Procedures (including critical care time)  Medications Ordered in ED Medications - No data to display  ED Course  I have reviewed the triage vital signs and the nursing notes.  Pertinent labs & imaging results that were available during my care of the patient were reviewed by me and considered in my medical decision making (see chart for details).  Clinical Course as of Jun 02 627  07-09-1970 Jun 01, 2020  Jun 03, 2020 Patient is now awake and alert.  She states that she drank too much alcohol last night.  Denies other ingestion.  She is calling for a ride.  She has been ambulatory in the hallway.  Do not feel she needs further work-up.   [CH]    Clinical Course User Index [CH] Kendell Gammon, [id number], MD   MDM Rules/Calculators/A&P                           Patient presents with altered mental status.  Reported alcohol use overnight.  She is arousable but does not provide much history.  Alcohol level 153.  She was allowed to rest comfortably in the hallway and remained  clinically stable.  On recheck, she is now awake and ambulatory.  She  reports that she drank too much alcohol.  She has no physical complaints.  Do not feel she needs further work-up at this time.  She will call a ride to come get her.  After history, exam, and medical workup I feel the patient has been appropriately medically screened and is safe for discharge home. Pertinent diagnoses were discussed with the patient. Patient was given return precautions.   Final Clinical Impression(s) / ED Diagnoses Final diagnoses:  Alcoholic intoxication without complication [facility])    Rx / DC Orders ED Discharge Orders    None       Moncia Annas, Mayer Masker, MD 06/01/20 [phone]

## 2020-06-01 NOTE — ED Notes (Signed)
Patient ambulatory to the lobby with a steady gait and her friend.

## 2020-06-02 ENCOUNTER — Telehealth: Payer: Self-pay

## 2020-06-02 NOTE — Telephone Encounter (Signed)
Transition Care Management Follow-up Telephone Call  Date of discharge and from where: [facility]  06/01/2020  How have you been since you were released from the hospital? [facility] but feels sore   Any questions or concerns? No  Items Reviewed:  Did the pt receive and understand the discharge instructions provided? Yes   Medications obtained and verified? Yes   Any new allergies since your discharge? No   Dietary orders reviewed? Yes  Do you have support at home? Yes   Functional Questionnaire: (I = Independent and D = Dependent) ADLs: I  Bathing/Dressing- I  Meal Prep- I  Eating- I  Maintaining continence- I  Transferring/Ambulation- I  Managing Meds- I  Follow up appointments reviewed:   PCP Hospital f/u appt confirmed? No  Will schedule appt with PCP  Specialist Hospital f/u appt confirmed? No   Are transportation arrangements needed? No   If their condition worsens, is the pt aware to call PCP or go to the Emergency Dept.? Yes  Was the patient provided with contact information for the PCP's office or ED? Yes  Was to pt encouraged to call back with questions or concerns? Yes

## 2020-06-02 NOTE — Telephone Encounter (Signed)
Transition Care Management Unsuccessful Follow-up Telephone Call  Date of discharge and from where:  Kaylee Kim 06/01/2020  Attempts:  1st Attempt  Reason for unsuccessful TCM follow-up call:  Left voice message

## 2020-06-03 ENCOUNTER — Telehealth: Payer: Self-pay | Admitting: *Deleted

## 2020-06-03 NOTE — Telephone Encounter (Signed)
Called patient to assist with follow up appointment ,no answer. Kaylee Kim PEC [phone]

## 2020-06-27 IMAGING — US US MFM OB TRANSVAGINAL
1 series · 12 of 28 positions shown · non-contrast
Comparison: none

[Series 1: us mfm ob transvaginal · 119 acquisitions, 12 frames shown]
[im 5/119]
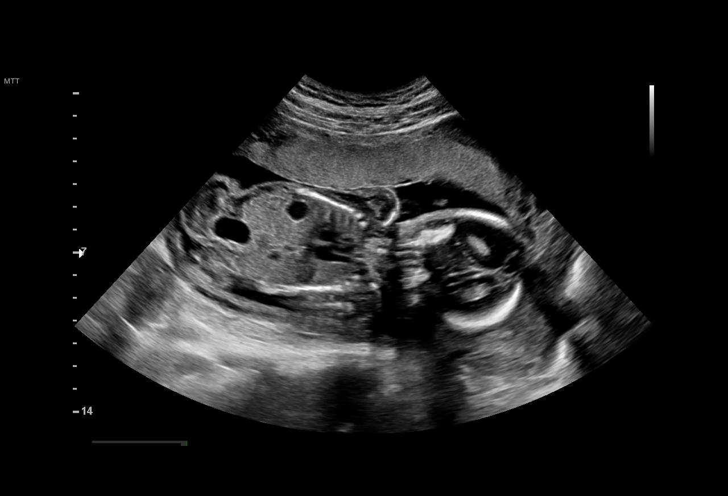
[im 14/119]
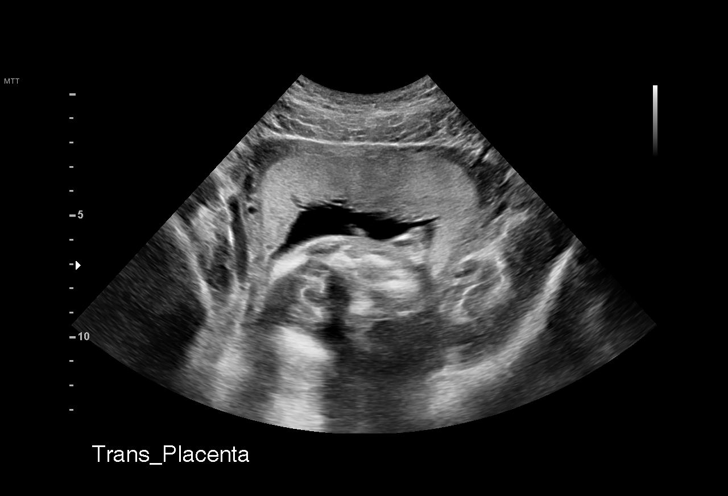
[im 22/119]
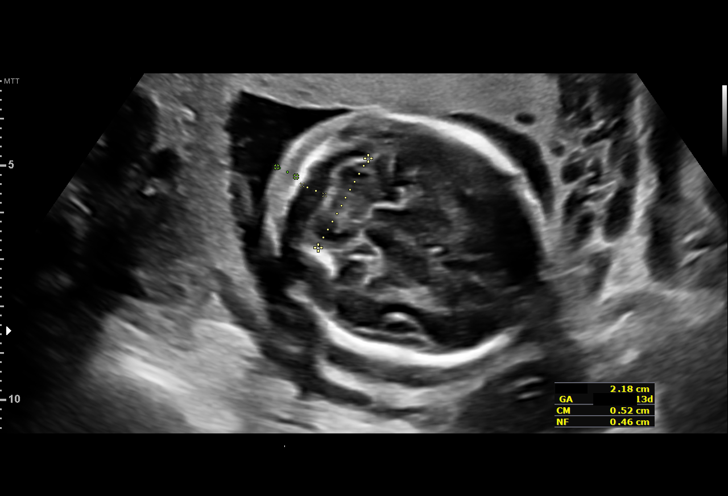
[im 35/119]
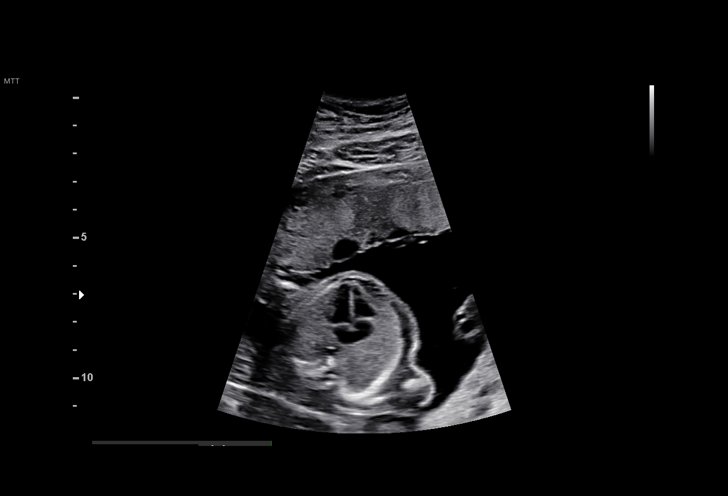
[im 44/119]
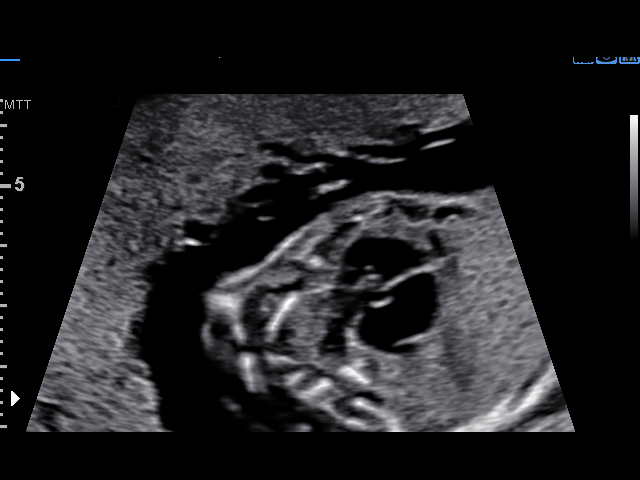
[im 53/119]
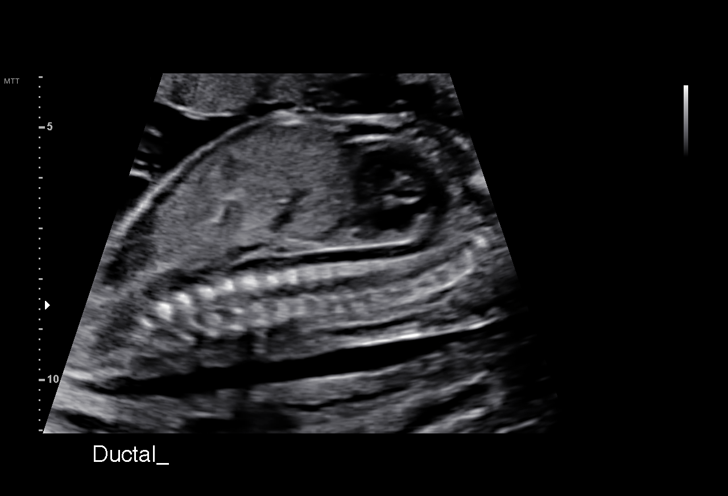
[im 66/119]
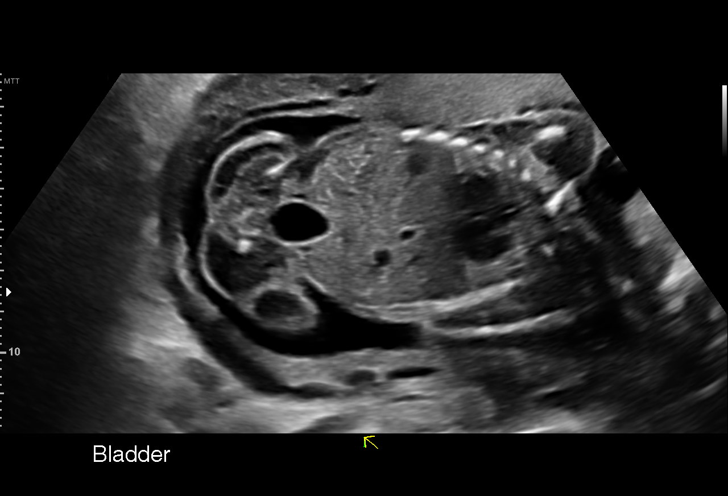
[im 75/119]
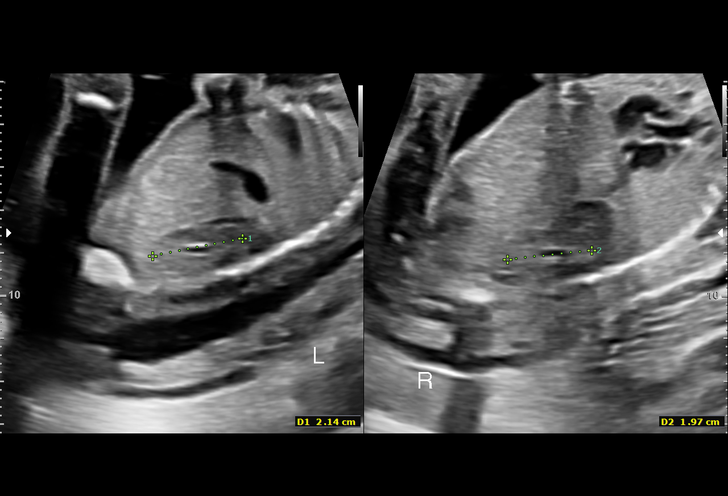
[im 84/119]
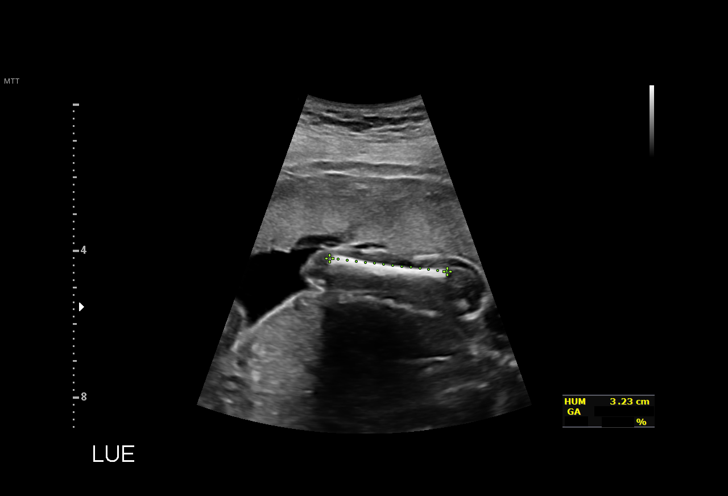
[im 97/119]
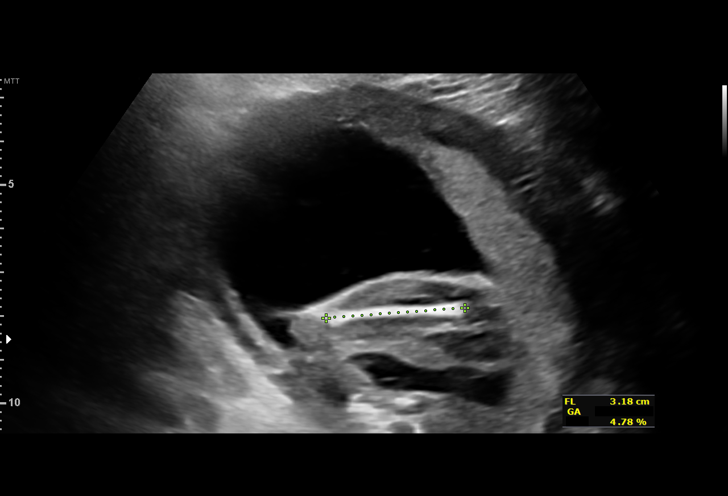
[im 105/119]
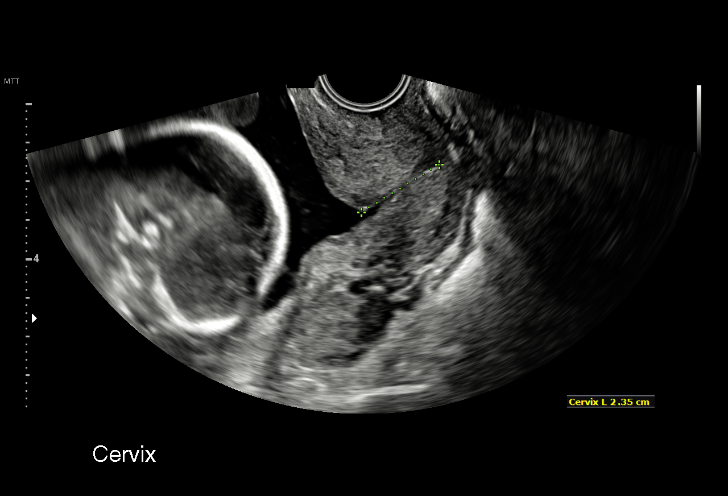
[im 114/119]
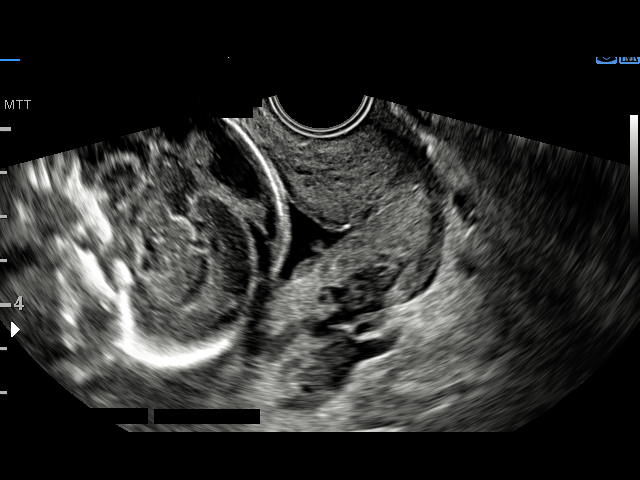

[12 of 28 positions shown; findings below may reference images not displayed]

[address]
                   [place]                                  [HOSPITAL]

  1  US MFM OB TRANSVAGINAL               [address]17.2      CHRISAN YAWDI
 ----------------------------------------------------------------------

 ----------------------------------------------------------------------
Indications

  21 weeks gestation of pregnancy
  Encounter for antenatal screening for
  malformations
  Poor obstetric history: Previous preterm
  delivery, antepartum
  Cervical shortening, second trimester
 ----------------------------------------------------------------------
Fetal Evaluation

 Num Of Fetuses:         1
 Fetal Heart Rate(bpm):  150
 Cardiac Activity:       Observed
 Presentation:           Cephalic
 Placenta:               Anterior
 P. Cord Insertion:      Visualized, central

 Amniotic Fluid
 AFI FV:      Within normal limits

                             Largest Pocket(cm)

Biometry

 BPD:      50.1  mm     G. Age:  21w 1d         44  %    CI:        81.05   %    70 - 86
                                                         FL/HC:      19.0   %    15.9 -
 HC:      175.7  mm     G. Age:  20w 1d          4  %    HC/AC:      1.08        1.06 -
 AC:      162.4  mm     G. Age:  21w 2d         44  %    FL/BPD:     66.7   %
 FL:       33.4  mm     G. Age:  20w 3d         16  %    FL/AC:      20.6   %    20 - 24
 HUM:      31.4  mm     G. Age:  20w 3d         26  %
 CER:        22  mm     G. Age:  21w 0d         39  %
 NFT:       4.6  mm
 LV:        6.2  mm
 CM:        5.2  mm

 Est. FW:     382  gm    0 lb 13 oz      24  %
OB History

 Blood Type:    B+
 Gravidity:    7         Term:   0        Prem:   2        SAB:   1
 TOP:          3       Ectopic:  0        Living: 2
Gestational Age

 LMP:           21w 2d        Date:  05/28/19                 EDD:   03/03/20
 U/S Today:     20w 5d                                        EDD:   03/07/20
 Best:          21w 2d     Det. By:  LMP  (05/28/19)          EDD:   03/03/20
Anatomy

 Cranium:               Appears normal         LVOT:                   Appears normal
 Cavum:                 Appears normal         Aortic Arch:            Appears normal
 Ventricles:            Appears normal         Ductal Arch:            Appears normal
 Choroid Plexus:        Appears normal         Diaphragm:              Appears normal
 Cerebellum:            Appears normal         Stomach:                Appears normal, left
                                                                       sided
 Posterior Fossa:       Appears normal         Abdomen:                Appears normal
 Nuchal Fold:           Appears normal         Abdominal Wall:         Appears nml (cord
                                                                       insert, abd wall)
 Face:                  Not well visualized    Cord Vessels:           Appears normal (3
                                                                       vessel cord)
 Lips:                  Not well visualized    Kidneys:                Appear normal
 Palate:                Not well visualized    Bladder:                Appears normal
 Thoracic:              Appears normal         Spine:                  Appears normal
 Heart:                 Appears normal         Upper Extremities:      Appears normal
                        (4CH, axis, and
                        situs)
 RVOT:                  Appears normal         Lower Extremities:      Appears normal

 Other:  Female gender.  Technically difficult due to fetal position.
Cervix Uterus Adnexa

 Cervix
 Length:            2.4  cm.
 Measured transvaginally.

 Uterus
 No abnormality visualized.

 Left Ovary
 Within normal limits. No adnexal mass visualized.

 Right Ovary
 Within normal limits. No adnexal mass visualized.

 Cul De Sac
 No free fluid seen.

 Adnexa
 No abnormality visualized.
Impression

 Ms. Viola, Quanita7 P2 at 21-weeks' gestation, is here for fetal
 anatomy scan.  On cell free fetal DNA screening, the risks of
 fetal aneuploidies are not increased.
 Obstetric history is significant for a spontaneous preterm
 delivery in 0880 at 23 weeks gestation of a male infant.  He is
 in good health now.  In 4958, she had a spontaneous preterm
 delivery at 34 weeks gestation of a male infant weighing
 1,960 grams at birth.  He is in good health.  In the second
 pregnancy, she took prophylactic progesterone injections.
 Patient takes prophylactic progesterone injections in this
 pregnancy.  She does not have symptoms of pelvic pressure
 or vaginal bleeding.

 We performed fetal anatomy scan.  Amniotic fluid is normal
 and good fetal activity seen.  Fetal biometry is consistent with
 her previously established dates.  No markers of aneuploidies
 or fetal structural defects are seen.
 Because of her history of preterm deliveries, we performed a
 transvaginal ultrasound to evaluate the cervical length.  The
 cervix measures between 2.4 and 2.5 cm.  No further
 shortening was seen on transfundal pressure.

 I counseled the patient with help of ultrasound images.
 History of preterm deliveries and short cervix (2.4 cm)
 increase the risk of recurrent preterm deliveries.  I discussed
 the option of rescue cerclage.  I explained the procedure and
 possible complications including bleeding, infection,
 miscarriage or injuries to bladder or bowel (rare).
 Alternatively, we can reevaluate the cervical length next week.

 Patient opted not to have cerclage now.  She will be returning
 next week for transvaginal ultrasound.
Recommendations

 -An appointment was made for the patient to return next week
 for completion of fetal anatomy and cervical length
 measurement.
                 [place], [place]

## 2020-12-11 ENCOUNTER — Other Ambulatory Visit: Payer: Self-pay

## 2020-12-11 DIAGNOSIS — Z3041 Encounter for surveillance of contraceptive pills: Secondary | ICD-10-CM

## 2020-12-11 MED ORDER — NORGESTIMATE-ETH ESTRADIOL 0.25-35 MG-MCG PO TABS: 1.0000 | ORAL_TABLET | Freq: Every day | ORAL | 2 refills | Status: AC

## 2021-01-08 ENCOUNTER — Ambulatory Visit: Payer: Medicaid Other | Admitting: Obstetrics and Gynecology

## 2021-04-06 DIAGNOSIS — R112 Nausea with vomiting, unspecified: Secondary | ICD-10-CM | POA: Diagnosis not present

## 2021-04-06 DIAGNOSIS — R197 Diarrhea, unspecified: Secondary | ICD-10-CM | POA: Diagnosis not present

## 2021-04-06 DIAGNOSIS — Z716 Tobacco abuse counseling: Secondary | ICD-10-CM | POA: Diagnosis not present

## 2021-10-16 ENCOUNTER — Ambulatory Visit: Payer: Medicaid Other | Admitting: Obstetrics and Gynecology

## 2022-04-19 DIAGNOSIS — R69 Illness, unspecified: Secondary | ICD-10-CM | POA: Diagnosis not present

## 2023-01-07 DIAGNOSIS — Z114 Encounter for screening for human immunodeficiency virus [HIV]: Secondary | ICD-10-CM | POA: Diagnosis not present

## 2023-01-07 DIAGNOSIS — Z3202 Encounter for pregnancy test, result negative: Secondary | ICD-10-CM | POA: Diagnosis not present

## 2023-01-07 DIAGNOSIS — N760 Acute vaginitis: Secondary | ICD-10-CM | POA: Diagnosis not present

## 2023-01-07 DIAGNOSIS — Z01419 Encounter for gynecological examination (general) (routine) without abnormal findings: Secondary | ICD-10-CM | POA: Diagnosis not present

## 2023-01-07 DIAGNOSIS — N898 Other specified noninflammatory disorders of vagina: Secondary | ICD-10-CM | POA: Diagnosis not present

## 2023-01-07 DIAGNOSIS — Z30011 Encounter for initial prescription of contraceptive pills: Secondary | ICD-10-CM | POA: Diagnosis not present

## 2023-10-19 ENCOUNTER — Other Ambulatory Visit: Payer: Self-pay

## 2023-10-19 ENCOUNTER — Ambulatory Visit: Payer: No Typology Code available for payment source | Admitting: Obstetrics and Gynecology

## 2023-10-24 NOTE — Progress Notes (Signed)
 Patient cancelled visit at time of visit.

## 2024-07-09 ENCOUNTER — Other Ambulatory Visit (HOSPITAL_COMMUNITY)
Admission: RE | Admit: 2024-07-09 | Discharge: 2024-07-09 | Disposition: A | Discharge status: Home / Self Care | Source: Ambulatory Visit | Attending: Obstetrics | Admitting: Obstetrics

## 2024-07-09 ENCOUNTER — Encounter: Payer: Self-pay | Admitting: Obstetrics

## 2024-07-09 ENCOUNTER — Ambulatory Visit (INDEPENDENT_AMBULATORY_CARE_PROVIDER_SITE_OTHER): Admitting: Obstetrics

## 2024-07-09 VITALS — BP 113/70 | HR 68 | Ht 63.0 in | Wt 182.2 lb

## 2024-07-09 DIAGNOSIS — E569 Vitamin deficiency, unspecified: Secondary | ICD-10-CM | POA: Diagnosis not present

## 2024-07-09 DIAGNOSIS — N898 Other specified noninflammatory disorders of vagina: Secondary | ICD-10-CM | POA: Diagnosis not present

## 2024-07-09 DIAGNOSIS — Z01419 Encounter for gynecological examination (general) (routine) without abnormal findings: Secondary | ICD-10-CM

## 2024-07-09 DIAGNOSIS — F17200 Nicotine dependence, unspecified, uncomplicated: Secondary | ICD-10-CM | POA: Diagnosis not present

## 2024-07-09 DIAGNOSIS — Z3009 Encounter for other general counseling and advice on contraception: Secondary | ICD-10-CM

## 2024-07-09 MED ORDER — PNV-SELECT 27-0.6-0.4 MG PO TABS: 1.0000 | ORAL_TABLET | Freq: Every day | ORAL | 3 refills | Status: AC

## 2024-07-09 MED ORDER — METRONIDAZOLE 0.75 % VA GEL: 1.0000 | Freq: Two times a day (BID) | VAGINAL | 6 refills | Status: AC

## 2024-07-09 NOTE — Progress Notes (Signed)
 Pt presents for annual. Pt wants all std testing. Pt declines bc. Pt has no questions or concerns at this time.

## 2024-07-09 NOTE — Progress Notes (Addendum)
 Subjective:        Kaylee Kim is a 33 y.o. female here for a routine exam.  Current complaints: Malodorous vaginal discharge.    Personal health questionnaire:  Is patient Ashkenazi Jewish, have a family history of breast and/or ovarian cancer: yes Is there a family history of uterine cancer diagnosed at age < 91, gastrointestinal cancer, urinary tract cancer, family member who is a Personnel Officer syndrome-associated carrier: no Is the patient overweight and hypertensive, family history of diabetes, personal history of gestational diabetes, preeclampsia or PCOS: no Is patient over 62, have PCOS,  family history of premature CHD under age 18, diabetes, smoke, have hypertension or peripheral artery disease:  no At any time, has a partner hit, kicked or otherwise hurt or frightened you?: no Over the past 2 weeks, have you felt down, depressed or hopeless?: no Over the past 2 weeks, have you felt little interest or pleasure in doing things?:no   Gynecologic History Patient's last menstrual period was 06/29/2024 (exact date). Contraception: condoms Last Pap: 2021. Results were: normal Last mammogram: n/a. Results were: n/a  Obstetric History OB History  Gravida Para Term Preterm AB Living  7 2 0 2 4 3   SAB IAB Ectopic Multiple Live Births  1 3 0 0 2    # Outcome Date GA Lbr Len/2nd Weight Sex Type Anes PTL Lv  7 Preterm 02/23/14 [redacted]w[redacted]d 17:10 / 00:20 4 lb 5.1 oz (1.96 kg) M Vag-Spont EPI  LIV  6 Preterm 06/05/11 [redacted]w[redacted]d 20:29  M Vag-Spont None  LIV  5 IAB 2010          4 SAB           3 IAB           2 IAB           1 Gravida             Past Medical History:  Diagnosis Date   Medical history non-contributory     Past Surgical History:  Procedure Laterality Date   CERVICAL CERCLAGE N/A 11/02/2019   Procedure: CERCLAGE CERVICAL;  Surgeon: Lorence Ozell CROME, MD;  Location: [facility] ORS;  Service: Gynecology;  Laterality: N/A;   DILATION AND CURETTAGE OF UTERUS       Current Outpatient  Medications:    BIOTIN PO, Take 2 capsules by mouth daily., Disp: , Rfl:    cetirizine (ZYRTEC) 10 MG tablet, Take 10 mg by mouth daily., Disp: , Rfl:    ibuprofen  (ADVIL ) 800 MG tablet, Take 1 tablet (800 mg total) by mouth 3 (three) times daily with meals as needed for fever, mild pain, moderate pain or cramping., Disp: 30 tablet, Rfl: 2   metroNIDAZOLE  (METROGEL ) 0.75 % vaginal gel, Place 1 Applicatorful vaginally 2 (two) times daily. Use after the period ends, monthly, for 6 months., Disp: 70 g, Rfl: 6   Prenatal Vit w/Fe-Methylfol-FA (PNV-SELECT) 27-0.6-0.4 MG TABS, Take 1 tablet by mouth daily before breakfast., Disp: 9 tablet, Rfl: 3   acetaminophen  (TYLENOL ) 500 MG tablet, Take 1,500 mg by mouth every 6 (six) hours as needed for mild pain. (Patient not taking: Reported on 07/09/2024), Disp: , Rfl:    docusate sodium  (COLACE) 100 MG capsule, Take 1 capsule (100 mg total) by mouth 2 (two) times daily as needed for mild constipation or moderate constipation. (Patient not taking: Reported on 07/09/2024), Disp: 30 capsule, Rfl: 2   norgestimate -ethinyl estradiol  (ORTHO-CYCLEN) 0.25-35 MG-MCG tablet, Take 1 tablet by mouth daily.  Do not start earlier than one month after delivery (Patient not taking: Reported on 07/09/2024), Disp: 30 tablet, Rfl: 2   sertraline  (ZOLOFT ) 25 MG tablet, Take 1 tablet (25 mg total) by mouth daily. (Patient not taking: Reported on 07/09/2024), Disp: 30 tablet, Rfl: 1 No Known Allergies  Social History   Tobacco Use   Smoking status: Heavy Smoker    Current packs/day: 3.00    Average packs/day: 3.0 packs/day for 7.0 years (21.0 ttl pk-yrs)    Types: Cigars, Cigarettes   Smokeless tobacco: Never   Tobacco comments:    stopped in August 2020  Substance Use Topics   Alcohol use: No    Family History  Problem Relation Age of Onset   Diabetes Mother       Review of Systems  Constitutional: negative for fatigue and weight loss Respiratory: negative for cough and  wheezing Cardiovascular: negative for chest pain, fatigue and palpitations Gastrointestinal: negative for abdominal pain and change in bowel habits Musculoskeletal:negative for myalgias Neurological: negative for gait problems and tremors Behavioral/Psych: negative for abusive relationship, depression Endocrine: negative for temperature intolerance    Genitourinary: positive for vaginal discharge.  negative for abnormal menstrual periods, genital lesions, hot flashes, sexual problems  Integument/breast: negative for breast lump, breast tenderness, nipple discharge and skin lesion(s)    Objective:       BP 113/70   Pulse 68   Ht 5' 3 (1.6 m)   Wt 182 lb 3.2 oz (82.6 kg)   LMP 06/29/2024 (Exact Date)   BMI 32.28 kg/m  General:   Alert and no distress  Skin:   no rash or abnormalities  Lungs:   clear to auscultation bilaterally  Heart:   regular rate and rhythm, S1, S2 normal, no murmur, click, rub or gallop  Breasts:   normal without suspicious masses, skin or nipple changes or axillary nodes  Abdomen:  normal findings: no organomegaly, soft, non-tender and no hernia  Pelvis:  External genitalia: normal general appearance Urinary system: urethral meatus normal and bladder without fullness, nontender Vaginal: normal without tenderness, induration or masses Cervix: normal appearance Adnexa: normal bimanual exam Uterus: anteverted and non-tender, normal size   Lab Review Urine pregnancy test Labs reviewed yes Radiologic studies reviewed no  I have spent a total of 20 minutes of face-to-face time, excluding clinical staff time, reviewing notes and preparing to see patient, ordering tests and/or medications, and counseling the patient.   Assessment:    1. Encounter for routine gynecological examination with Papanicolaou smear of cervix (Primary) Rx: - Cytology - PAP( [facility])  2. Vaginal discharge Rx: - HIV antibody (with reflex) - RPR - Hepatitis C Antibody -  Hepatitis B Surface AntiGEN - Cervicovaginal ancillary only( [place]) - metroNIDAZOLE  (METROGEL ) 0.75 % vaginal gel; Place 1 Applicatorful vaginally 2 (two) times daily. Use after the period ends, monthly, for 6 months.  Dispense: 70 g; Refill: 6  3. Encounter for other general counseling and advice on contraception - options discussed.  Declines hormonal contraception. - encouraged continued condom use for STD Prevention / contraception  4. Tobacco dependency - cessation encouraged.  5. Vitamin deficiency Rx: - Prenatal Vit w/Fe-Methylfol-FA (PNV-SELECT) 27-0.6-0.4 MG TABS; Take 1 tablet by mouth daily before breakfast.  Dispense: 9 tablet; Refill: 3     Plan:    Education reviewed: calcium  supplements, depression evaluation, low fat, low cholesterol diet, safe sex/STD prevention, self breast exams, smoking cessation, and weight bearing exercise. Contraception: condoms. Follow up in: 1  year.   Meds ordered this encounter  Medications   metroNIDAZOLE  (METROGEL ) 0.75 % vaginal gel    Sig: Place 1 Applicatorful vaginally 2 (two) times daily. Use after the period ends, monthly, for 6 months.    Dispense:  70 g    Refill:  6   Prenatal Vit w/Fe-Methylfol-FA (PNV-SELECT) 27-0.6-0.4 MG TABS    Sig: Take 1 tablet by mouth daily before breakfast.    Dispense:  9 tablet    Refill:  3   Orders Placed This Encounter  Procedures   HIV antibody (with reflex)   RPR   Hepatitis C Antibody   Hepatitis B Surface AntiGEN    CARLIN RONAL CENTERS, MD, FACOG Attending Obstetrician & Gynecologist, [facility] for [facility], [facility], [place] 07/09/2024

## 2024-07-10 ENCOUNTER — Ambulatory Visit: Payer: Self-pay | Admitting: Obstetrics

## 2024-07-10 LAB — CERVICOVAGINAL ANCILLARY ONLY
Bacterial Vaginitis (gardnerella): POSITIVE — AB
Candida Glabrata: NEGATIVE
Candida Vaginitis: NEGATIVE
Chlamydia: NEGATIVE
Comment: NEGATIVE
Comment: NEGATIVE
Comment: NEGATIVE
Comment: NEGATIVE
Comment: NEGATIVE
Comment: NORMAL
Neisseria Gonorrhea: NEGATIVE
Trichomonas: NEGATIVE

## 2024-07-10 LAB — CYTOLOGY - PAP
Comment: NEGATIVE
Diagnosis: NEGATIVE
High risk HPV: NEGATIVE

## 2024-07-10 LAB — HEPATITIS B SURFACE ANTIGEN: Hepatitis B Surface Ag: NEGATIVE

## 2024-07-10 LAB — RPR: RPR Ser Ql: NONREACTIVE

## 2024-07-10 LAB — HIV ANTIBODY (ROUTINE TESTING W REFLEX): HIV Screen 4th Generation wRfx: NONREACTIVE

## 2024-07-10 LAB — HEPATITIS C ANTIBODY: Hep C Virus Ab: NONREACTIVE
